# Patient Record
Sex: Female | Born: 1980 | Race: White | Hispanic: No | Marital: Married | Smoking: Never smoker
Health system: Southern US, Community
[De-identification: ages and names within clinical notes are randomized; demographics above are authoritative.]

## PROBLEM LIST (undated history)

## (undated) ENCOUNTER — Inpatient Hospital Stay (HOSPITAL_COMMUNITY): Payer: Self-pay

## (undated) DIAGNOSIS — G43909 Migraine, unspecified, not intractable, without status migrainosus: Secondary | ICD-10-CM

## (undated) DIAGNOSIS — T883XXA Malignant hyperthermia due to anesthesia, initial encounter: Secondary | ICD-10-CM

## (undated) DIAGNOSIS — Z800 Family history of malignant neoplasm of digestive organs: Secondary | ICD-10-CM

## (undated) DIAGNOSIS — R011 Cardiac murmur, unspecified: Secondary | ICD-10-CM

## (undated) DIAGNOSIS — Z803 Family history of malignant neoplasm of breast: Secondary | ICD-10-CM

## (undated) DIAGNOSIS — Z87442 Personal history of urinary calculi: Secondary | ICD-10-CM

## (undated) DIAGNOSIS — Z8041 Family history of malignant neoplasm of ovary: Secondary | ICD-10-CM

## (undated) DIAGNOSIS — F419 Anxiety disorder, unspecified: Secondary | ICD-10-CM

## (undated) DIAGNOSIS — F32A Depression, unspecified: Secondary | ICD-10-CM

## (undated) DIAGNOSIS — Z801 Family history of malignant neoplasm of trachea, bronchus and lung: Secondary | ICD-10-CM

## (undated) DIAGNOSIS — G43109 Migraine with aura, not intractable, without status migrainosus: Secondary | ICD-10-CM

## (undated) DIAGNOSIS — Z808 Family history of malignant neoplasm of other organs or systems: Secondary | ICD-10-CM

## (undated) DIAGNOSIS — O039 Complete or unspecified spontaneous abortion without complication: Secondary | ICD-10-CM

## (undated) HISTORY — DX: Family history of malignant neoplasm of digestive organs: Z80.0

## (undated) HISTORY — PX: BREAST EXCISIONAL BIOPSY: SUR124

## (undated) HISTORY — DX: Family history of malignant neoplasm of ovary: Z80.41

## (undated) HISTORY — PX: MOUTH SURGERY: SHX715

## (undated) HISTORY — DX: Family history of malignant neoplasm of breast: Z80.3

## (undated) HISTORY — DX: Family history of malignant neoplasm of other organs or systems: Z80.8

## (undated) HISTORY — DX: Complete or unspecified spontaneous abortion without complication: O03.9

## (undated) HISTORY — PX: BREAST BIOPSY: SHX20

## (undated) HISTORY — DX: Family history of malignant neoplasm of trachea, bronchus and lung: Z80.1

---

## 2011-10-23 ENCOUNTER — Encounter (HOSPITAL_COMMUNITY): Payer: Self-pay

## 2011-10-23 ENCOUNTER — Emergency Department (HOSPITAL_COMMUNITY)
Admission: EM | Admit: 2011-10-23 | Discharge: 2011-10-23 | Disposition: A | Discharge status: Home / Self Care | Payer: Self-pay | Attending: Emergency Medicine | Admitting: Emergency Medicine

## 2011-10-23 DIAGNOSIS — T6391XA Toxic effect of contact with unspecified venomous animal, accidental (unintentional), initial encounter: Secondary | ICD-10-CM | POA: Insufficient documentation

## 2011-10-23 DIAGNOSIS — T7840XA Allergy, unspecified, initial encounter: Secondary | ICD-10-CM

## 2011-10-23 DIAGNOSIS — T63461A Toxic effect of venom of wasps, accidental (unintentional), initial encounter: Secondary | ICD-10-CM | POA: Insufficient documentation

## 2011-10-23 DIAGNOSIS — Z880 Allergy status to penicillin: Secondary | ICD-10-CM | POA: Insufficient documentation

## 2011-10-23 MED ORDER — PREDNISONE 20 MG PO TABS
60.0000 mg | ORAL_TABLET | Freq: Once | ORAL | Status: AC
  Administered 2011-10-23: 60 mg via ORAL
  Filled 2011-10-23: qty 3

## 2011-10-23 MED ORDER — PREDNISONE 50 MG PO TABS: 50.0000 mg | ORAL_TABLET | Freq: Every day | ORAL | Status: DC

## 2011-10-23 MED ORDER — EPINEPHRINE 0.3 MG/0.3ML IJ DEVI: 0.3000 mg | INTRAMUSCULAR | Status: DC | PRN

## 2011-10-23 NOTE — ED Notes (Signed)
Pt in from home states was stung by bee on left calf and left buttock states allergy to bees states took 2 benadryl, did not use epi pen d/t expiration, presents with redness and swelling to eyes, denies difficulty swallowing airway intact

## 2011-10-23 NOTE — ED Provider Notes (Signed)
History     CSN: [id number]  Arrival date & time 10/23/11  1024   First MD Initiated Contact with Patient 10/23/11 1040      Chief Complaint  Patient presents with  . Insect Bite    (Consider location/radiation/quality/duration/timing/severity/associated sxs/prior treatment) HPI Pt with hx of bee sting allergy presents with c/o bee sting on left anterior shin and left buttock.  This occurred approx 9:30am.  She c/o mild pain at the site of the stings.  Did note some facial tingling and redness under her eyes.  Took benadryl x 2, did not use her epipen when she noted it was expired.  No lip/tongue swelling, no difficulty breathing or vomiting or fainting.  There are no other associated systemic symptoms, there are no other alleviating or modifying factors.   History reviewed. No pertinent past medical history.  History reviewed. No pertinent past surgical history.  No family history on file.  History  Substance Use Topics  . Smoking status: Never Smoker   . Smokeless tobacco: Not on file  . Alcohol Use: No    OB History    Grav Para Term Preterm Abortions TAB SAB Ect Mult Living                  Review of Systems ROS reviewed and all otherwise negative except for mentioned in HPI  Allergies  Bee venom and Penicillins  Home Medications   Current Outpatient Rx  Name Route Sig Dispense Refill  . ACETAMINOPHEN 500 MG PO TABS Oral Take 500 mg by mouth every 6 (six) hours as needed. pain    . IBUPROFEN 200 MG PO TABS Oral Take 400 mg by mouth every 6 (six) hours as needed. pain    . ADULT MULTIVITAMIN W/MINERALS CH Oral Take 1 tablet by mouth daily.    Marland Kitchen EPINEPHRINE 0.3 MG/0.3ML IJ DEVI Intramuscular Inject 0.3 mLs (0.3 mg total) into the muscle as needed. 2 Device 0  . PREDNISONE 50 MG PO TABS Oral Take 1 tablet (50 mg total) by mouth daily. 5 tablet 0    BP 140/84  Pulse 109  Temp 98.2 F (36.8 C) (Oral)  Resp 18  SpO2 100%  LMP 10/01/2011 Vitals  reviewed Physical Exam Physical Examination: General appearance - alert, well appearing, and in no distress Mental status - alert, oriented to person, place, and time Eyes - pupils equal and reactive, no conjunctival injection Mouth - mucous membranes moist, pharynx normal without lesions Chest - clear to auscultation, no wheezes, rales or rhonchi, symmetric air entry, no increased respiratory effort Heart - normal rate, regular rhythm, normal S1, S2, no murmurs, rubs, clicks or gallops Abdomen - soft, nontender, nondistended, no masses or organomegaly Musculoskeletal - no joint tenderness, deformity or swelling Extremities - peripheral pulses normal, no pedal edema, no clubbing or cyanosis Skin - normal coloration and turgor except approx 1cm of local reaction on left anterior lower leg, and left buttock, no hives, brisk cap refill  ED Course  Procedures (including critical care time)  Labs Reviewed - No data to display No results found.   1. Allergic reaction       MDM  Pt with allergy to bee stings- was stung twice by a bee on her left lower extremity and left buttock.  Pt took 2 benadryl prior to arrival.  No sob, no lip or tongue swelling.  Pt given prednisone po.  Discharged with strict return precautions.  Will give a rx for epipen as hers  is expired.  Pt agreeable with this plan.         Ethelda Chick, MD 10/24/11 1029

## 2012-05-05 ENCOUNTER — Encounter: Payer: Self-pay | Admitting: Obstetrics and Gynecology

## 2012-06-01 ENCOUNTER — Encounter: Payer: Self-pay | Admitting: Obstetrics and Gynecology

## 2013-03-22 ENCOUNTER — Ambulatory Visit (INDEPENDENT_AMBULATORY_CARE_PROVIDER_SITE_OTHER): Payer: BC Managed Care – PPO | Admitting: Women's Health

## 2013-03-22 ENCOUNTER — Encounter: Payer: Self-pay | Admitting: Women's Health

## 2013-03-22 ENCOUNTER — Other Ambulatory Visit (HOSPITAL_COMMUNITY)
Admission: RE | Admit: 2013-03-22 | Discharge: 2013-03-22 | Disposition: A | Discharge status: Home / Self Care | Payer: BC Managed Care – PPO | Source: Ambulatory Visit | Attending: Gynecology | Admitting: Gynecology

## 2013-03-22 VITALS — BP 114/70 | Ht 67.0 in | Wt 168.2 lb

## 2013-03-22 DIAGNOSIS — Z01419 Encounter for gynecological examination (general) (routine) without abnormal findings: Secondary | ICD-10-CM

## 2013-03-22 DIAGNOSIS — N926 Irregular menstruation, unspecified: Secondary | ICD-10-CM

## 2013-03-22 LAB — TSH: TSH: 0.555 u[IU]/mL (ref 0.350–4.500)

## 2013-03-22 NOTE — Progress Notes (Signed)
Patricia Franco 09-10-1980 [id number]    History:    The patient presents for annual exam.  Cycles every 25-36 days. Has had 2 early miscarriages while on contraception last being December 2013 at about 6 weeks. Using no contraception for one month, intercourse 3-4 times per week. Currently on a prenatal vitamin daily, aware of safe pregnancy behaviors, positive rubella status, states had  normal labs May 2014 including a prolactin. Tdap 2011.  Reports husband is healthy.  Normal Pap history, did not receive gardasil.   Past medical history, past surgical history, family history and social history were all reviewed and documented in the EPIC chart. From a family of 10 children, all healthy. Father type II diabetic. 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   ROS:  A  ROS was performed and pertinent positives and negatives are included in the history.  Exam:  Filed Vitals:   03/22/13 1013  BP: 114/70    General appearance:  Normal, numerous tattoos Head/Neck:  Normal, without cervical or supraclavicular adenopathy. Thyroid:  Symmetrical, normal in size, without palpable masses or nodularity. Respiratory  Effort:  Normal  Auscultation:  Clear without wheezing or rhonchi Cardiovascular  Auscultation:  Regular rate, without rubs, murmurs or gallops  Edema/varicosities:  Not grossly evident Abdominal  Soft,nontender, without masses, guarding or rebound.  Liver/spleen:  No organomegaly noted  Hernia:  None appreciated  Skin  Inspection:  Grossly normal  Palpation:  Grossly normal Neurologic/psychiatric  Orientation:  Normal with appropriate conversation.  Mood/affect:  Normal  Genitourinary    Breasts: Examined lying and sitting/pierced.     Right: Without masses, retractions, discharge or axillary adenopathy.     Left: Without masses, retractions, discharge or axillary adenopathy.   Inguinal/mons:  Normal without inguinal adenopathy  External  genitalia:  Normal  BUS/Urethra/Skene's glands:  Normal  Bladder:  Normal  Vagina:  Normal  Cervix:  Normal  Uterus:   normal in size, shape and contour.  Midline and mobile  Adnexa/parametria:     Rt: Without masses or tenderness.   Lt: Without masses or tenderness.  Anus and perineum: Normal  Digital rectal exam: Normal sphincter tone without palpated masses or tenderness  Assessment/Plan:  32 y.o.MWF G2P0  for annual exam desiring conception.  Irregular cycles  Plan: Instructed to fax lab results for review. TSH, hCG qualitative, UA, Pap. SBE's, continue PNV daily, regular exercise and healthy lifestyle.  Aware of a healthy pregnancy behaviors. Instructed to return to office with missed cycle for viability ultrasound. Aware we no longer deliver.   Harrington Challenger WHNP, 11:05 AM 03/22/2013

## 2013-03-22 NOTE — Patient Instructions (Signed)
Tdap vaccine for all  Pregnancy If you are planning on getting pregnant, it is a good idea to make a preconception appointment with your caregiver to discuss having a healthy lifestyle before getting pregnant. This includes diet, weight, exercise, taking prenatal vitamins (especially folic acid, which helps prevent brain and spinal cord defects), avoiding alcohol, smoking and illegal drugs, medical problems (diabetes, convulsions), family history of genetic problems, working conditions, and immunizations. It is better to have knowledge of these things and do something about them before getting pregnant. During your pregnancy, it is important to follow certain guidelines in order to have a healthy baby. It is very important to get good prenatal care and follow your caregiver's instructions. Prenatal care includes all the medical care you receive before your baby's birth. This helps to prevent problems during the pregnancy and childbirth. HOME CARE INSTRUCTIONS   Start your prenatal visits by the 12th week of pregnancy or earlier, if possible. At first, appointments are usually scheduled monthly. They become more frequent in the last 2 months before delivery. It is important that you keep your caregiver's appointments and follow your caregiver's instructions regarding medication use, exercise, and diet.  During pregnancy, you are providing food for you and your baby. Eat a regular, well-balanced diet. Choose foods such as meat, fish, milk and other dairy products, vegetables, fruits, whole-grain breads and cereals. Your caregiver will inform you of the ideal weight gain depending on your current height and weight. Drink lots of liquids. Try to drink 8 glasses of water a day.  Alcohol is associated with a number of birth defects including fetal alcohol syndrome. It is best to avoid alcohol completely. Smoking will cause low birth rate and prematurity. Use of alcohol and nicotine during your pregnancy also  increases the chances that your child will be chemically dependent later in their life and may contribute to SIDS (Sudden Infant Death Syndrome).  Do not use illegal drugs.  Only take prescription or over-the-counter medications that are recommended by your caregiver. Other medications can cause genetic and physical problems in the baby.  Morning sickness can often be helped by keeping soda crackers at the bedside. Eat a few before getting up in the morning.  A sexual relationship may be continued until near the end of pregnancy if there are no other problems such as early (premature) leaking of amniotic fluid from the membranes, vaginal bleeding, painful intercourse or belly (abdominal) pain.  Exercise regularly. Check with your caregiver if you are unsure of the safety of some of your exercises.  Do not use hot tubs, steam rooms or saunas. These increase the risk of fainting and hurting yourself and the baby. Swimming is OK for exercise. Get plenty of rest, including afternoon naps when possible, especially in the third trimester.  Avoid toxic odors and chemicals.  Do not wear high heels. They may cause you to lose your balance and fall.  Do not lift over 5 pounds. If you do lift anything, lift with your legs and thighs, not your back.  Avoid long trips, especially in the third trimester.  If you have to travel out of the city or state, take a copy of your medical records with you. SEEK IMMEDIATE MEDICAL CARE IF:   You develop an unexplained oral temperature above 102 F (38.9 C), or as your caregiver suggests.  You have leaking of fluid from the vagina. If leaking membranes are suspected, take your temperature and inform your caregiver of this when you call.  There is vaginal spotting or bleeding. Notify your caregiver of the amount and how many pads are used.  You continue to feel sick to your stomach (nauseous) and have no relief from remedies suggested, or you throw up (vomit)  blood or coffee ground like materials.  You develop upper abdominal pain.  You have round ligament discomfort in the lower abdominal area. This still must be evaluated by your caregiver.  You feel contractions of the uterus.  You do not feel the baby move, or there is less movement than before.  You have painful urination.  You have abnormal vaginal discharge.  You have persistent diarrhea.  You get a severe headache.  You have problems with your vision.  You develop muscle weakness.  You feel dizzy and faint.  You develop shortness of breath.  You develop chest pain.  You have back pain that travels down to your leg and feet.  You feel irregular or a very fast heartbeat.  You develop excessive weight gain in a short period of time (5 pounds in 3 to 5 days).  You are involved in a domestic violence situation. Document Released: 04/05/2005 Document Revised: 10/05/2011 Document Reviewed: 09/27/2008 [facility] Patient Information 2014 [place], [place].

## 2013-03-22 NOTE — Addendum Note (Signed)
Addended by: Richardson Chiquito on: 03/22/2013 11:54 AM   Modules accepted: Orders

## 2013-03-23 ENCOUNTER — Other Ambulatory Visit: Payer: Self-pay | Admitting: Women's Health

## 2013-03-23 DIAGNOSIS — N390 Urinary tract infection, site not specified: Secondary | ICD-10-CM

## 2013-03-23 LAB — URINALYSIS W MICROSCOPIC + REFLEX CULTURE
Casts: NONE SEEN
Crystals: NONE SEEN
Glucose, UA: NEGATIVE mg/dL
Hgb urine dipstick: NEGATIVE
Ketones, ur: NEGATIVE mg/dL
Nitrite: POSITIVE — AB
Specific Gravity, Urine: 1.015 (ref 1.005–1.030)
Urobilinogen, UA: 0.2 mg/dL (ref 0.0–1.0)
pH: 7 (ref 5.0–8.0)

## 2013-03-23 MED ORDER — NITROFURANTOIN MONOHYD MACRO 100 MG PO CAPS: 100.0000 mg | ORAL_CAPSULE | Freq: Two times a day (BID) | ORAL | Status: AC

## 2013-03-25 LAB — URINE CULTURE: Colony Count: >=100000

## 2013-03-28 ENCOUNTER — Other Ambulatory Visit: Payer: Self-pay | Admitting: Women's Health

## 2013-03-28 ENCOUNTER — Telehealth: Payer: Self-pay | Admitting: *Deleted

## 2013-03-28 DIAGNOSIS — N912 Amenorrhea, unspecified: Secondary | ICD-10-CM

## 2013-03-28 MED ORDER — MEDROXYPROGESTERONE ACETATE 10 MG PO TABS: 10.0000 mg | ORAL_TABLET | Freq: Every day | ORAL | Status: DC

## 2013-03-28 NOTE — Telephone Encounter (Signed)
Telephone call, states continues with low abdominal cramping but no cycle. Repeated home U PT which was negative. Negative qualitative hCG 03/22/2013. Provera 10 for 5 days. Instructed to call if no cycle after completing.

## 2013-03-28 NOTE — Telephone Encounter (Signed)
Pt called stating to call if no cycle yet? Pt is c/o headaches and cramping states this has been going on for about 1 week and 1/2 now. Please advise

## 2013-04-19 NOTE — L&D Delivery Note (Signed)
Delivery Note At 10:25 AM a viable and healthy female was delivered via Vaginal, Spontaneous Delivery (Presentation: Right Occiput Anterior).  APGAR: 9, 9; weight .   Placenta status: Intact, Spontaneous.  Cord: 3 vessels with the following complications: loose nuchal cord.  Cord ZO:XWRU  Anesthesia: Epidural  Episiotomy: None Lacerations: 1st degree Suture Repair: 3.0 vicryl rapide Est. Blood Loss (mL): 200  Mom to postpartum.  Baby to Couplet care / Skin to Skin.  Billye Pickerel R 01/07/2014, 11:21 AM

## 2013-05-10 ENCOUNTER — Encounter: Payer: Self-pay | Admitting: Women's Health

## 2013-05-10 ENCOUNTER — Ambulatory Visit (INDEPENDENT_AMBULATORY_CARE_PROVIDER_SITE_OTHER): Payer: BC Managed Care – PPO | Admitting: Women's Health

## 2013-05-10 DIAGNOSIS — N912 Amenorrhea, unspecified: Secondary | ICD-10-CM

## 2013-05-10 LAB — PREGNANCY, URINE: Preg Test, Ur: POSITIVE

## 2013-05-10 NOTE — Progress Notes (Signed)
Patient ID: Patricia AlbeeKathy Axtman, female   DOB: 24-May-1980, 33 y.o.   MRN: [id number] Presents with 6  positive U PTs. History of 2  early SABs. Denies bleeding or cramping, states has had  breast tenderness and heartburn. LMP 04/01/2013.  Exam: Very happy with pregnancy. Positive U PT.  Early pregnancy with history of 2 SABs  Plan: Schedule viability ultrasound. Aware we no longer deliver. Continue prenatal vitamin daily. Safe pregnancy behaviors reviewed. Instructed to call if any bleeding or pain. Alternate Tums and Rolaids for heartburn.

## 2013-05-10 NOTE — Patient Instructions (Signed)
prPregnancy If you are planning on getting pregnant, it is a good idea to make a preconception appointment with your caregiver to discuss having a healthy lifestyle before getting pregnant. This includes diet, weight, exercise, taking prenatal vitamins (especially folic acid, which helps prevent brain and spinal cord defects), avoiding alcohol, smoking and illegal drugs, medical problems (diabetes, convulsions), family history of genetic problems, working conditions, and immunizations. It is better to have knowledge of these things and do something about them before getting pregnant. During your pregnancy, it is important to follow certain guidelines in order to have a healthy baby. It is very important to get good prenatal care and follow your caregiver's instructions. Prenatal care includes all the medical care you receive before your baby's birth. This helps to prevent problems during the pregnancy and childbirth. HOME CARE INSTRUCTIONS   Start your prenatal visits by the 12th week of pregnancy or earlier, if possible. At first, appointments are usually scheduled monthly. They become more frequent in the last 2 months before delivery. It is important that you keep your caregiver's appointments and follow your caregiver's instructions regarding medication use, exercise, and diet.  During pregnancy, you are providing food for you and your baby. Eat a regular, well-balanced diet. Choose foods such as meat, fish, milk and other dairy products, vegetables, fruits, whole-grain breads and cereals. Your caregiver will inform you of the ideal weight gain depending on your current height and weight. Drink lots of liquids. Try to drink 8 glasses of water a day.  Alcohol is associated with a number of birth defects including fetal alcohol syndrome. It is best to avoid alcohol completely. Smoking will cause low birth rate and prematurity. Use of alcohol and nicotine during your pregnancy also increases the chances  that your child will be chemically dependent later in their life and may contribute to SIDS (Sudden Infant Death Syndrome).  Do not use illegal drugs.  Only take prescription or over-the-counter medications that are recommended by your caregiver. Other medications can cause genetic and physical problems in the baby.  Morning sickness can often be helped by keeping soda crackers at the bedside. Eat a few before getting up in the morning.  A sexual relationship may be continued until near the end of pregnancy if there are no other problems such as early (premature) leaking of amniotic fluid from the membranes, vaginal bleeding, painful intercourse or belly (abdominal) pain.  Exercise regularly. Check with your caregiver if you are unsure of the safety of some of your exercises.  Do not use hot tubs, steam rooms or saunas. These increase the risk of fainting and hurting yourself and the baby. Swimming is OK for exercise. Get plenty of rest, including afternoon naps when possible, especially in the third trimester.  Avoid toxic odors and chemicals.  Do not wear high heels. They may cause you to lose your balance and fall.  Do not lift over 5 pounds. If you do lift anything, lift with your legs and thighs, not your back.  Avoid long trips, especially in the third trimester.  If you have to travel out of the city or state, take a copy of your medical records with you. SEEK IMMEDIATE MEDICAL CARE IF:   You develop an unexplained oral temperature above 102 F (38.9 C), or as your caregiver suggests.  You have leaking of fluid from the vagina. If leaking membranes are suspected, take your temperature and inform your caregiver of this when you call.  There is vaginal spotting  or bleeding. Notify your caregiver of the amount and how many pads are used.  You continue to feel sick to your stomach (nauseous) and have no relief from remedies suggested, or you throw up (vomit) blood or coffee ground  like materials.  You develop upper abdominal pain.  You have round ligament discomfort in the lower abdominal area. This still must be evaluated by your caregiver.  You feel contractions of the uterus.  You do not feel the baby move, or there is less movement than before.  You have painful urination.  You have abnormal vaginal discharge.  You have persistent diarrhea.  You get a severe headache.  You have problems with your vision.  You develop muscle weakness.  You feel dizzy and faint.  You develop shortness of breath.  You develop chest pain.  You have back pain that travels down to your leg and feet.  You feel irregular or a very fast heartbeat.  You develop excessive weight gain in a short period of time (5 pounds in 3 to 5 days).  You are involved in a domestic violence situation. Document Released: 04/05/2005 Document Revised: 10/05/2011 Document Reviewed: 09/27/2008 [facility] Patient Information 2014 [place], [place].

## 2013-05-18 ENCOUNTER — Other Ambulatory Visit: Payer: Self-pay | Admitting: Women's Health

## 2013-05-18 ENCOUNTER — Ambulatory Visit (INDEPENDENT_AMBULATORY_CARE_PROVIDER_SITE_OTHER): Payer: BC Managed Care – PPO

## 2013-05-18 ENCOUNTER — Ambulatory Visit (INDEPENDENT_AMBULATORY_CARE_PROVIDER_SITE_OTHER): Payer: BC Managed Care – PPO | Admitting: Women's Health

## 2013-05-18 ENCOUNTER — Encounter: Payer: Self-pay | Admitting: Women's Health

## 2013-05-18 DIAGNOSIS — O3680X0 Pregnancy with inconclusive fetal viability, not applicable or unspecified: Secondary | ICD-10-CM

## 2013-05-18 DIAGNOSIS — N912 Amenorrhea, unspecified: Secondary | ICD-10-CM

## 2013-05-18 LAB — US OB TRANSVAGINAL

## 2013-05-18 NOTE — Progress Notes (Signed)
Patient ID: Doree AlbeeKathy Franco, female   DOB: [birth year], 33 y.o.   MRN: [id number] Presents for viability ultrasound. History of 2 SABs early. Denies bleeding, pain, change in discharge or urinary symptoms. States is doing well.  Ultrasound: Intrauterine gestational sac with yolk sac. Fetal pole with fetal heart tones. Ovaries appear normal with CLC on right ovary. No free fluid noted. 6 weeks 5 days dates/ clinical 6 weeks 0 days.  Early pregnancy  Plan: Keep scheduled new OB appointment. Congratulated, continue prenatal vitamins daily. Aware of safe behaviors with pregnancy.

## 2013-06-08 LAB — OB RESULTS CONSOLE ABO/RH: RH Type: POSITIVE

## 2013-06-08 LAB — OB RESULTS CONSOLE GC/CHLAMYDIA
CHLAMYDIA, DNA PROBE: NEGATIVE
GC PROBE AMP, GENITAL: NEGATIVE

## 2013-06-08 LAB — OB RESULTS CONSOLE HEPATITIS B SURFACE ANTIGEN: Hepatitis B Surface Ag: NEGATIVE

## 2013-06-08 LAB — OB RESULTS CONSOLE ANTIBODY SCREEN: ANTIBODY SCREEN: NEGATIVE

## 2013-06-08 LAB — OB RESULTS CONSOLE HIV ANTIBODY (ROUTINE TESTING): HIV: NONREACTIVE

## 2013-06-08 LAB — OB RESULTS CONSOLE RUBELLA ANTIBODY, IGM: Rubella: IMMUNE

## 2013-06-08 LAB — OB RESULTS CONSOLE RPR: RPR: NONREACTIVE

## 2013-10-06 ENCOUNTER — Encounter (HOSPITAL_COMMUNITY): Payer: Self-pay | Admitting: *Deleted

## 2013-10-06 ENCOUNTER — Inpatient Hospital Stay (HOSPITAL_COMMUNITY)
Admission: AD | Admit: 2013-10-06 | Discharge: 2013-10-06 | Disposition: A | Discharge status: Home / Self Care | Payer: BC Managed Care – PPO | Source: Ambulatory Visit | Attending: Obstetrics and Gynecology | Admitting: Obstetrics and Gynecology

## 2013-10-06 DIAGNOSIS — G43109 Migraine with aura, not intractable, without status migrainosus: Secondary | ICD-10-CM | POA: Diagnosis present

## 2013-10-06 DIAGNOSIS — R51 Headache: Secondary | ICD-10-CM | POA: Insufficient documentation

## 2013-10-06 DIAGNOSIS — O9989 Other specified diseases and conditions complicating pregnancy, childbirth and the puerperium: Principal | ICD-10-CM

## 2013-10-06 DIAGNOSIS — H43399 Other vitreous opacities, unspecified eye: Secondary | ICD-10-CM | POA: Insufficient documentation

## 2013-10-06 DIAGNOSIS — O99891 Other specified diseases and conditions complicating pregnancy: Secondary | ICD-10-CM | POA: Insufficient documentation

## 2013-10-06 DIAGNOSIS — G43119 Migraine with aura, intractable, without status migrainosus: Secondary | ICD-10-CM

## 2013-10-06 HISTORY — DX: Malignant hyperthermia due to anesthesia, initial encounter: T88.3XXA

## 2013-10-06 HISTORY — DX: Migraine, unspecified, not intractable, without status migrainosus: G43.909

## 2013-10-06 HISTORY — DX: Migraine with aura, not intractable, without status migrainosus: G43.109

## 2013-10-06 HISTORY — DX: Cardiac murmur, unspecified: R01.1

## 2013-10-06 LAB — COMPREHENSIVE METABOLIC PANEL
ALBUMIN: 2.9 g/dL — AB (ref 3.5–5.2)
ALT: 9 U/L (ref 0–35)
AST: 12 U/L (ref 0–37)
Alkaline Phosphatase: 66 U/L (ref 39–117)
BUN: 8 mg/dL (ref 6–23)
CHLORIDE: 102 meq/L (ref 96–112)
CO2: 23 mEq/L (ref 19–32)
CREATININE: 0.58 mg/dL (ref 0.50–1.10)
Calcium: 8.5 mg/dL (ref 8.4–10.5)
GFR calc Af Amer: >90 mL/min (ref >90)
GFR calc non Af Amer: >90 mL/min (ref >90)
Glucose, Bld: 82 mg/dL (ref 70–99)
POTASSIUM: 3.8 meq/L (ref 3.7–5.3)
SODIUM: 136 meq/L — AB (ref 137–147)
Total Protein: 6.4 g/dL (ref 6.0–8.3)

## 2013-10-06 LAB — URINALYSIS, ROUTINE W REFLEX MICROSCOPIC
Bilirubin Urine: NEGATIVE
GLUCOSE, UA: NEGATIVE mg/dL
Hgb urine dipstick: NEGATIVE
KETONES UR: NEGATIVE mg/dL
LEUKOCYTES UA: NEGATIVE
Nitrite: NEGATIVE
PH: 6 (ref 5.0–8.0)
Protein, ur: NEGATIVE mg/dL
Specific Gravity, Urine: <1.005 — ABNORMAL LOW (ref 1.005–1.030)
Urobilinogen, UA: 0.2 mg/dL (ref 0.0–1.0)

## 2013-10-06 LAB — CBC
HCT: 35.1 % — ABNORMAL LOW (ref 36.0–46.0)
Hemoglobin: 12 g/dL (ref 12.0–15.0)
MCH: 32 pg (ref 26.0–34.0)
MCHC: 34.2 g/dL (ref 30.0–36.0)
MCV: 93.6 fL (ref 78.0–100.0)
PLATELETS: 207 10*3/uL (ref 150–400)
RBC: 3.75 MIL/uL — ABNORMAL LOW (ref 3.87–5.11)
RDW: 14.1 % (ref 11.5–15.5)
WBC: 10.4 10*3/uL (ref 4.0–10.5)

## 2013-10-06 LAB — URIC ACID: Uric Acid, Serum: 4.1 mg/dL (ref 2.4–7.0)

## 2013-10-06 MED ORDER — LACTATED RINGERS IV BOLUS (SEPSIS)
500.0000 mL | Freq: Once | INTRAVENOUS | Status: AC
  Administered 2013-10-06: 500 mL via INTRAVENOUS

## 2013-10-06 MED ORDER — PROMETHAZINE HCL 25 MG/ML IJ SOLN
12.5000 mg | Freq: Once | INTRAMUSCULAR | Status: AC
  Administered 2013-10-06: 12.5 mg via INTRAVENOUS
  Filled 2013-10-06: qty 1

## 2013-10-06 MED ORDER — BUTORPHANOL TARTRATE 1 MG/ML IJ SOLN
1.0000 mg | Freq: Once | INTRAMUSCULAR | Status: AC
  Administered 2013-10-06: 1 mg via INTRAVENOUS
  Filled 2013-10-06: qty 1

## 2013-10-06 MED ORDER — BUTALBITAL-APAP-CAFFEINE 50-325-40 MG PO TABS: 1.0000 | ORAL_TABLET | Freq: Four times a day (QID) | ORAL | Status: DC | PRN

## 2013-10-06 NOTE — Discharge Instructions (Signed)

## 2013-10-06 NOTE — MAU Note (Signed)
Headache for several days. Worse today. Seeing floaters. Teeth and face hurts. Throbbing h/a. Light sensitive but not to sounds. Hx migraines. Itching all over last few hrs

## 2013-10-06 NOTE — MAU Note (Signed)
Headache for several days. Worse today. Seeing floaters. Teeth and face hurts. Throbbing h/a

## 2013-10-16 ENCOUNTER — Encounter (HOSPITAL_COMMUNITY): Payer: Self-pay | Admitting: *Deleted

## 2013-10-16 ENCOUNTER — Encounter (HOSPITAL_COMMUNITY): Payer: Self-pay

## 2013-11-12 ENCOUNTER — Encounter (HOSPITAL_COMMUNITY): Payer: Self-pay | Admitting: Anesthesiology

## 2013-11-12 NOTE — Anesthesia Preprocedure Evaluation (Deleted)
Anesthesia Evaluation    History of Anesthesia Complications (+) MALIGNANT HYPERTHERMIA, Family history of anesthesia reaction and history of anesthetic complications (Strong FH of MH: Pt's sisters x2 have tested + for MH by muscle bx.  Pt's mother has had MH x2.  Pt's uncle and grandmother died of MH.  Pt had GA at 33 yo and procedure was aborted when she spiked a temp.)  Airway       Dental   Pulmonary          Cardiovascular     Neuro/Psych  Headaches (migraines - about once a year),    GI/Hepatic   Endo/Other    Renal/GU      Musculoskeletal   Abdominal   Peds  Hematology   Anesthesia Other Findings   Reproductive/Obstetrics (+) Pregnancy (EDD 01/06/14)                         Anesthesia Physical Anesthesia Plan  ASA: II  Anesthesia Plan: Epidural   Post-op Pain Management:    Induction:   Airway Management Planned:   Additional Equipment:   Intra-op Plan:   Post-operative Plan:   Informed Consent:   Plan Discussed with: Surgeon  Anesthesia Plan Comments: (Plan early epidural and plan to have a clean OR kept available while pt is in labor in the event that a non-triggering GA is needed.    Spoke with pt by phone [phone]  on 11/12/13.  All questions answered and reassurance given.  Jasmine DecemberA. Birdie Beveridge, MD)       Anesthesia Quick Evaluation

## 2013-11-30 ENCOUNTER — Encounter (HOSPITAL_COMMUNITY): Payer: Self-pay | Admitting: Anesthesiology

## 2013-11-30 ENCOUNTER — Encounter (HOSPITAL_COMMUNITY)
Admission: RE | Admit: 2013-11-30 | Discharge: 2013-11-30 | Disposition: A | Discharge status: Home / Self Care | Payer: BC Managed Care – PPO | Source: Ambulatory Visit | Attending: Obstetrics & Gynecology | Admitting: Obstetrics & Gynecology

## 2013-11-30 ENCOUNTER — Encounter (HOSPITAL_COMMUNITY): Payer: Self-pay

## 2013-11-30 DIAGNOSIS — Z01818 Encounter for other preprocedural examination: Secondary | ICD-10-CM | POA: Diagnosis present

## 2013-11-30 NOTE — Pre-Procedure Instructions (Signed)
Brief interview and health history for anesthesia consult.Dr. Malen GauzeFoster notifed that pt here to see him.

## 2013-11-30 NOTE — Anesthesia Preprocedure Evaluation (Deleted)
Anesthesia Evaluation  Patient identified by MRN, date of birth, ID band Patient awake    Reviewed: Allergy & Precautions, H&P   History of Anesthesia Complications (+) MALIGNANT HYPERTHERMIA and history of anesthetic complications  Airway Mallampati: II TM Distance: >3 FB Neck ROM: Full    Dental no notable dental hx. (+) Caps   Pulmonary neg pulmonary ROS,  breath sounds clear to auscultation  Pulmonary exam normal       Cardiovascular negative cardio ROS  + Valvular Problems/Murmurs Rhythm:Regular Rate:Normal     Neuro/Psych  Headaches,    GI/Hepatic negative GI ROS, Neg liver ROS,   Endo/Other  negative endocrine ROS  Renal/GU negative Renal ROS  negative genitourinary   Musculoskeletal Strong family Hx/o MH   Abdominal (+) + obese,   Peds  Hematology negative hematology ROS (+)   Anesthesia Other Findings   Reproductive/Obstetrics (+) Pregnancy                           Anesthesia Physical Anesthesia Plan  ASA: II  Anesthesia Plan: Epidural   Post-op Pain Management:    Induction:   Airway Management Planned: Natural Airway  Additional Equipment:   Intra-op Plan:   Post-operative Plan:   Informed Consent:   Plan Discussed with:   Anesthesia Plan Comments: (I had a long discussion with Ms. Mcbrearty concerning her family hx/o MH. Her mother had an episode of MH. She has 2 younger sisters who have had muscle biopsies with positive Halothane contraction tests. She has a maternal uncle who died from an MH episode as well as a maternal great aunt and grandfather who also died after anesthesia. I explained in detail why we needed to know about her prior to delivery and to make sure she makes the nurses aware she probably has MH and to notify the anesthesiologist on call when she goes into labor. We discussed triggering agents and events which have been known to precipitate MH.  We also discussed treatment as well as prevention and precautionary measures. I recommended that she have the blood test for MH at minimum to see if that tests positive for MH. I also recommended that she get a Medic Alert bracelet or necklace to wear all the time. When she goes into labor I recommend early epidural placement and set aside a separate OR probably Rm. 2 just in case an emergency C/section is needed. I also told her to talk with Dr. Juliene PinaMody to see if she thinks a MFM consult with genetic counseling is necessary. I referred her to [facility] web site if she wanted to learn more about MH. She and her husband understand the plan. I spent 30 minutes face to face contact with the patient.)        Anesthesia Quick Evaluation

## 2013-12-04 LAB — OB RESULTS CONSOLE GBS: GBS: NEGATIVE

## 2013-12-15 ENCOUNTER — Inpatient Hospital Stay (HOSPITAL_COMMUNITY)
Admission: AD | Admit: 2013-12-15 | Discharge: 2013-12-15 | Disposition: A | Discharge status: Home / Self Care | Payer: BC Managed Care – PPO | Source: Ambulatory Visit | Attending: Obstetrics & Gynecology | Admitting: Obstetrics & Gynecology

## 2013-12-15 ENCOUNTER — Encounter (HOSPITAL_COMMUNITY): Payer: Self-pay | Admitting: *Deleted

## 2013-12-15 DIAGNOSIS — O479 False labor, unspecified: Secondary | ICD-10-CM | POA: Diagnosis present

## 2013-12-15 LAB — URINALYSIS, ROUTINE W REFLEX MICROSCOPIC
Bilirubin Urine: NEGATIVE
Glucose, UA: NEGATIVE mg/dL
Hgb urine dipstick: NEGATIVE
Ketones, ur: NEGATIVE mg/dL
LEUKOCYTES UA: NEGATIVE
NITRITE: NEGATIVE
PH: 7 (ref 5.0–8.0)
Protein, ur: NEGATIVE mg/dL
Urobilinogen, UA: 0.2 mg/dL (ref 0.0–1.0)

## 2013-12-15 NOTE — MAU Note (Signed)
C/o ucs since 1400 yesterday that have gotten closer and stronger; ucs are 7-8 minutes apart now;

## 2013-12-15 NOTE — Discharge Instructions (Signed)

## 2013-12-15 NOTE — MAU Note (Signed)
Pt states ctx's q5-7 minutes, denies bleeding or gush of fluid. Does note more mucus like discharge.

## 2013-12-17 ENCOUNTER — Inpatient Hospital Stay (HOSPITAL_COMMUNITY)
Admission: AD | Admit: 2013-12-17 | Discharge: 2013-12-17 | Disposition: A | Discharge status: Home / Self Care | Payer: BC Managed Care – PPO | Source: Ambulatory Visit | Attending: Obstetrics and Gynecology | Admitting: Obstetrics and Gynecology

## 2013-12-17 ENCOUNTER — Encounter (HOSPITAL_COMMUNITY): Payer: Self-pay | Admitting: *Deleted

## 2013-12-17 DIAGNOSIS — S1093XA Contusion of unspecified part of neck, initial encounter: Secondary | ICD-10-CM

## 2013-12-17 DIAGNOSIS — Z9181 History of falling: Secondary | ICD-10-CM

## 2013-12-17 DIAGNOSIS — Z3689 Encounter for other specified antenatal screening: Secondary | ICD-10-CM

## 2013-12-17 DIAGNOSIS — S0083XA Contusion of other part of head, initial encounter: Secondary | ICD-10-CM | POA: Insufficient documentation

## 2013-12-17 DIAGNOSIS — O99891 Other specified diseases and conditions complicating pregnancy: Secondary | ICD-10-CM | POA: Diagnosis not present

## 2013-12-17 DIAGNOSIS — Y929 Unspecified place or not applicable: Secondary | ICD-10-CM | POA: Insufficient documentation

## 2013-12-17 DIAGNOSIS — S0003XA Contusion of scalp, initial encounter: Secondary | ICD-10-CM | POA: Insufficient documentation

## 2013-12-17 DIAGNOSIS — O368190 Decreased fetal movements, unspecified trimester, not applicable or unspecified: Secondary | ICD-10-CM | POA: Insufficient documentation

## 2013-12-17 DIAGNOSIS — O9989 Other specified diseases and conditions complicating pregnancy, childbirth and the puerperium: Secondary | ICD-10-CM

## 2013-12-17 DIAGNOSIS — W108XXA Fall (on) (from) other stairs and steps, initial encounter: Secondary | ICD-10-CM | POA: Insufficient documentation

## 2013-12-17 MED ORDER — ACETAMINOPHEN 500 MG PO TABS
1000.0000 mg | ORAL_TABLET | Freq: Four times a day (QID) | ORAL | Status: DC | PRN
  Administered 2013-12-17: 1000 mg via ORAL
  Filled 2013-12-17: qty 2

## 2013-12-17 NOTE — MAU Provider Note (Signed)
History     CSN: [id number]  Arrival date and time: 12/17/13 1517   First Provider Initiated Contact with Patient 12/17/13 1601      Chief Complaint  Patient presents with  . Fall  . Decreased Fetal Movement   HPI Comments: G3P0020 .1 wks s/p fall down flight of stairs today around 1330. Denies LOC. Unsure of abdominal or head contact. Reports contact of right side of face with banister. Landed on right side. +nausea and HA immediately after, only mild HA now. No fetal movement since. No VB or LOF. BH ctx present, not painful.    OB History   Grav Para Term Preterm Abortions TAB SAB Ect Mult Living   Past Medical History  Diagnosis Date  . Miscarriage 2012, 2013  . Heart murmur   . Migraines   . Migraine headache with aura 10/06/2013  . Malignant hyperthermia     pt's mother  . Malignant hyperthermia     Past Surgical History  Procedure Laterality Date  . Mouth surgery      Family History  Problem Relation Age of Onset  . Other Mother     malignant hyperthermia  . Other Sister     malignant hyperthermia  . Other Brother     malignant hyperthermia  . Thyroid disease Maternal Aunt   . Other Maternal Uncle     malignant hyperthermia  . Other Sister     malignant hyperthermia  . Other Sister     malignant hyperthermia  . Other Sister     malignant hyperthermia  . Other Sister     malignant hyperthermia  . Other Brother     malignant hyperthermia  . Other Brother     malignant hyperthermia  . Other Sister     malignant hyperthermia    History  Substance Use Topics  . Smoking status: Never Smoker   . Smokeless tobacco: Not on file  . Alcohol Use: No    Allergies:  Allergies  Allergen Reactions  . Bee Venom Anaphylaxis and Swelling  . Penicillins   . Penicillins Hives and Swelling    Prescriptions prior to admission  Medication Sig Dispense Refill  . acetaminophen (TYLENOL) 500 MG tablet Take 500 mg by mouth every 6  (six) hours as needed for mild pain.       Marland Kitchen NIFEdipine (PROCARDIA) 10 MG capsule Take 10 mg by mouth every 6 (six) hours.      . Prenatal Vit-Fe Fumarate-FA (PRENATAL MULTIVITAMIN) TABS tablet Take 1 tablet by mouth daily.      Marland Kitchen EPINEPHrine (EPIPEN IJ) Inject 1 each as directed as needed (for anaphylaxis).        Review of Systems  Constitutional: Negative.   Eyes: Negative.   Respiratory: Negative.   Cardiovascular: Negative.   Gastrointestinal: Negative.   Genitourinary: Negative.   Musculoskeletal: Positive for falls.  Skin: Negative.   Neurological: Positive for headaches.  Endo/Heme/Allergies: Negative.   Psychiatric/Behavioral: Negative.    Physical Exam   Blood pressure 112/65, pulse 85, temperature 98.3 F (36.8 C), temperature source Oral, resp. rate 18, height  (1.702 m), weight 91.627 kg (202 lb), last menstrual period 04/01/2013, SpO2 100.00%.  Physical Exam  Constitutional: She is oriented to person, place, and time. She appears well-developed and well-nourished.  HENT:  Head: Normocephalic.  Large edematous right zygomatic contusion   Eyes: Pupils are equal, round, and reactive  to light.  Neck: Normal range of motion. Neck supple.  Cardiovascular: Normal rate.   Respiratory: Effort normal.  GI: Soft.  Genitourinary: Vagina normal.  SVE: 0/50/-2  Musculoskeletal: Normal range of motion.  Neurological: She is alert and oriented to person, place, and time.  Skin: Skin is warm and dry.  Psychiatric: She has a normal mood and affect.    MAU Course  Procedures Prolonged fetal monitoring NST + fetal movement noted   Assessment and Plan  37.[redacted] week gestation S/p fall Facial contusion Reactive NST-no sx of abruption Rh positive  Discharge, proceed immediately to [facility] for head/face evaluation Labor precautions/abruption precautions Follow-up with Dr. Juliene Pina in 3 days in office Dr. Billy Coast notified of A/P, agrees.   Arlena Marsan,  N 12/17/2013, 4:12 PM

## 2013-12-17 NOTE — MAU Note (Signed)
Urine in lab 

## 2013-12-17 NOTE — Discharge Instructions (Signed)
Placental Abruption Your placenta is the organ that nourishes your unborn baby (fetus). Your baby gets his or her blood supply and nutrients through your placenta. It is your baby's life support system. It is attached to the inside of your uterus until after your baby is born.  Placental abruption is when the placenta partly or completely separates from the uterus before your baby is born. This is rare, but it can happen any time after 20 weeks of pregnancy. A small separation may not cause problems, but a large separation may be dangerous for you and your baby. CAUSES  Most of the time the cause of a placental abruption is unknown. Though it is rare, a placental abruption can be caused by:   An abdominal injury.   The baby turning from a buttocks-first position (breech presentation) or a sideways position (transverse) to a headfirst position (cephalic).   Delivering the first of multiple babies (twins, triplets, or more).   Sudden loss of amniotic fluid (premature rupture of the membranes).   An abnormally short umbilical cord. RISK FACTORS Some risk factors make a placental abruption more likely, including:  History of placental abruption.  High blood pressure (hypertension).  Smoking.  Alcohol intake.  Blood clotting problems.  Too much amniotic fluid.  Having had multiples (twins or triplets or more).  Seizures and convulsions.  Diabetes mellitus.  Having had more than four children.  Age 42 years or older.  Illegal drug use.  Injury to your abdomen. SIGNS AND SYMPTOMS  A small placental abruption may not cause symptoms. If you do have symptoms, they may include:  Mild abdominal pain.  Slight vaginal bleeding. Symptoms of severe placental abruption depend on the size of the separation and the stage of pregnancy. Symptoms may include:   Sudden pain in your uterus.  Abdominal pain.  Vaginal bleeding.  Tender uterus.  Severe abdominal pain with  tenderness.  Continual contractions of your uterus.  Back pain.  Weakness, light-headedness. DIAGNOSIS  Placental abruption is suspected when a pregnant woman develops sudden pain in her uterus. The health care provider will check whether the uterus is very tender, hard, and enlarging and whether the baby has an abnormal heart rate or rhythm. Ultrasonography (commonly called an ultrasound) will be done. Blood work will also be done to make sure that there are enough healthy red blood cells and that there are no clotting problems or signs of too much blood loss. TREATMENT  Placental abruption is usually an emergency. It requires treatment right away. Your treatment will depend on:   The amount of bleeding.  Whether you or you baby are in distress.  The stage of your pregnancy.  The maturity of the baby. Treatment for partial separation of the placenta is bed rest and close observation. You also may need a blood transfusion or to receive fluids through an IV tube. Treatment for complete placental separation is delivery of your baby. You may have a cesarean delivery if your baby is in distress. HOME CARE INSTRUCTIONS   Only take medicines as directed by your health care provider.  Arrange for help at home before and after you deliver the baby, especially if you had a cesarean delivery or lost a lot of blood.  Get plenty of rest and sleep.  Do not have sexual intercourse until your health care provider says it is okay.  Do not use tampons or douche unless your health care provider says it is okay. SEEK MEDICAL CARE IF:  You  have light vaginal bleeding or spotting.  You have any type of trauma, such as a fall or jolt during an accident.  You are having trouble avoiding drugs, alcohol, or smoking. SEEK IMMEDIATE MEDICAL CARE IF:  You have vaginal bleeding.  You have abdominal pain.  You have continuous uterine contractions.  You have a hard, tender uterus.  You do not feel  the baby move, or the baby moves very little. MAKE SURE YOU:  Understand these instructions.  Will watch your condition.  Will get help right away if you are not doing well or get worse. Document Released: 04/05/2005 Document Revised: 04/10/2013 Document Reviewed: 01/26/2013 [facility] Patient Information 2015 [place], [place]. This information is not intended to replace advice given to you by your health care provider. Make sure you discuss any questions you have with your health care provider. Braxton Hicks Contractions Contractions of the uterus can occur throughout pregnancy. Contractions are not always a sign that you are in labor.  WHAT ARE BRAXTON HICKS CONTRACTIONS?  Contractions that occur before labor are called Braxton Hicks contractions, or false labor. Toward the end of pregnancy (32-34 weeks), these contractions can develop more often and may become more forceful. This is not true labor because these contractions do not result in opening (dilatation) and thinning of the cervix. They are sometimes difficult to tell apart from true labor because these contractions can be forceful and people have different pain tolerances. You should not feel embarrassed if you go to the hospital with false labor. Sometimes, the only way to tell if you are in true labor is for your health care provider to look for changes in the cervix. If there are no prenatal problems or other health problems associated with the pregnancy, it is completely safe to be sent home with false labor and await the onset of true labor. HOW CAN YOU TELL THE DIFFERENCE BETWEEN TRUE AND FALSE LABOR? False Labor  The contractions of false labor are usually shorter and not as hard as those of true labor.   The contractions are usually irregular.   The contractions are often felt in the front of the lower abdomen and in the groin.   The contractions may go away when you walk around or change positions while lying down.    The contractions get weaker and are shorter lasting as time goes on.   The contractions do not usually become progressively stronger, regular, and closer together as with true labor.  True Labor  Contractions in true labor last 30-70 seconds, become very regular, usually become more intense, and increase in frequency.   The contractions do not go away with walking.   The discomfort is usually felt in the top of the uterus and spreads to the lower abdomen and low back.   True labor can be determined by your health care provider with an exam. This will show that the cervix is dilating and getting thinner.  WHAT TO REMEMBER  Keep up with your usual exercises and follow other instructions given by your health care provider.   Take medicines as directed by your health care provider.   Keep your regular prenatal appointments.   Eat and drink lightly if you think you are going into labor.   If Braxton Hicks contractions are making you uncomfortable:   Change your position from lying down or resting to walking, or from walking to resting.   Sit and rest in a tub of warm water.   Drink 2-3 glasses  of water. Dehydration may cause these contractions.   Do slow and deep breathing several times an hour.  WHEN SHOULD I SEEK IMMEDIATE MEDICAL CARE? Seek immediate medical care if:  Your contractions become stronger, more regular, and closer together.   You have fluid leaking or gushing from your vagina.   You have a fever.   You pass blood-tinged mucus.   You have vaginal bleeding.   You have continuous abdominal pain.   You have low back pain that you never had before.   You feel your baby's head pushing down and causing pelvic pressure.   Your baby is not moving as much as it used to.  Document Released: 04/05/2005 Document Revised: 04/10/2013 Document Reviewed: 01/15/2013 [facility] Patient Information 2015 [place], [place]. This information is not  intended to replace advice given to you by your health care provider. Make sure you discuss any questions you have with your health care provider.

## 2013-12-17 NOTE — MAU Note (Signed)
Patient presents to MAU with c/o decreased fetal movement after falling down stairs at 1:20 this afternoon; states has felt no movement since the fall. FHR 140's; EFM applied and assessing. Denies VB, LOF, or contractions at this time. States having lower back pain that is cramping in nature. RN noticed patient with bruised black right eye; patient states happened with fall. Ice pack given. Patient alert and oriented x4. CNM made aware of patients arrival.

## 2013-12-20 ENCOUNTER — Telehealth (HOSPITAL_COMMUNITY): Payer: Self-pay | Admitting: *Deleted

## 2013-12-20 ENCOUNTER — Encounter (HOSPITAL_COMMUNITY): Payer: Self-pay | Admitting: *Deleted

## 2013-12-20 NOTE — Telephone Encounter (Signed)
Preadmission screen  

## 2013-12-21 ENCOUNTER — Other Ambulatory Visit: Payer: Self-pay | Admitting: Obstetrics & Gynecology

## 2013-12-30 ENCOUNTER — Inpatient Hospital Stay (HOSPITAL_COMMUNITY): Admission: RE | Admit: 2013-12-30 | Payer: BC Managed Care – PPO | Source: Ambulatory Visit

## 2014-01-06 ENCOUNTER — Encounter (HOSPITAL_COMMUNITY): Payer: Self-pay

## 2014-01-06 ENCOUNTER — Inpatient Hospital Stay (HOSPITAL_COMMUNITY)
Admission: RE | Admit: 2014-01-06 | Discharge: 2014-01-09 | DRG: 775 | Disposition: A | Discharge status: Home / Self Care | Payer: BC Managed Care – PPO | Source: Ambulatory Visit | Attending: Obstetrics & Gynecology | Admitting: Obstetrics & Gynecology

## 2014-01-06 VITALS — BP 120/68 | HR 77 | Temp 98.0°F | Resp 18 | Ht 67.0 in | Wt 202.0 lb

## 2014-01-06 DIAGNOSIS — O480 Post-term pregnancy: Principal | ICD-10-CM | POA: Diagnosis present

## 2014-01-06 DIAGNOSIS — G43109 Migraine with aura, not intractable, without status migrainosus: Secondary | ICD-10-CM

## 2014-01-06 DIAGNOSIS — Z8489 Family history of other specified conditions: Secondary | ICD-10-CM

## 2014-01-06 DIAGNOSIS — O99891 Other specified diseases and conditions complicating pregnancy: Secondary | ICD-10-CM | POA: Diagnosis present

## 2014-01-06 DIAGNOSIS — Z3490 Encounter for supervision of normal pregnancy, unspecified, unspecified trimester: Secondary | ICD-10-CM

## 2014-01-06 LAB — CBC
HEMATOCRIT: 35.4 % — AB (ref 36.0–46.0)
HEMOGLOBIN: 12.1 g/dL (ref 12.0–15.0)
MCH: 31.8 pg (ref 26.0–34.0)
MCHC: 34.2 g/dL (ref 30.0–36.0)
MCV: 92.9 fL (ref 78.0–100.0)
Platelets: 178 10*3/uL (ref 150–400)
RBC: 3.81 MIL/uL — ABNORMAL LOW (ref 3.87–5.11)
RDW: 13.6 % (ref 11.5–15.5)
WBC: 9.8 10*3/uL (ref 4.0–10.5)

## 2014-01-06 MED ORDER — PHENYLEPHRINE 40 MCG/ML (10ML) SYRINGE FOR IV PUSH (FOR BLOOD PRESSURE SUPPORT)
80.0000 ug | PREFILLED_SYRINGE | INTRAVENOUS | Status: DC | PRN
  Filled 2014-01-06: qty 10
  Filled 2014-01-06: qty 2
  Filled 2014-01-06: qty 10

## 2014-01-06 MED ORDER — PHENYLEPHRINE 40 MCG/ML (10ML) SYRINGE FOR IV PUSH (FOR BLOOD PRESSURE SUPPORT)
80.0000 ug | PREFILLED_SYRINGE | INTRAVENOUS | Status: DC | PRN
  Filled 2014-01-06: qty 2

## 2014-01-06 MED ORDER — DIPHENHYDRAMINE HCL 50 MG/ML IJ SOLN: 12.5000 mg | INTRAMUSCULAR | Status: DC | PRN

## 2014-01-06 MED ORDER — LACTATED RINGERS IV SOLN: 500.0000 mL | INTRAVENOUS | Status: DC | PRN

## 2014-01-06 MED ORDER — MISOPROSTOL 25 MCG QUARTER TABLET
25.0000 ug | ORAL_TABLET | ORAL | Status: DC | PRN
  Administered 2014-01-06: 25 ug via VAGINAL
  Filled 2014-01-06: qty 1
  Filled 2014-01-06: qty 0.25

## 2014-01-06 MED ORDER — CITRIC ACID-SODIUM CITRATE 334-500 MG/5ML PO SOLN: 30.0000 mL | ORAL | Status: DC | PRN

## 2014-01-06 MED ORDER — OXYCODONE-ACETAMINOPHEN 5-325 MG PO TABS: 1.0000 | ORAL_TABLET | ORAL | Status: DC | PRN

## 2014-01-06 MED ORDER — LACTATED RINGERS IV SOLN: INTRAVENOUS | Status: DC

## 2014-01-06 MED ORDER — ACETAMINOPHEN 325 MG PO TABS
650.0000 mg | ORAL_TABLET | ORAL | Status: DC | PRN
Start: 2014-01-06 — End: 2014-01-07

## 2014-01-06 MED ORDER — OXYTOCIN 40 UNITS IN LACTATED RINGERS INFUSION - SIMPLE MED
62.5000 mL/h | INTRAVENOUS | Status: DC
  Filled 2014-01-06: qty 1000

## 2014-01-06 MED ORDER — TERBUTALINE SULFATE 1 MG/ML IJ SOLN: 0.2500 mg | Freq: Once | INTRAMUSCULAR | Status: AC | PRN

## 2014-01-06 MED ORDER — ZOLPIDEM TARTRATE 5 MG PO TABS
5.0000 mg | ORAL_TABLET | Freq: Every evening | ORAL | Status: DC | PRN
  Administered 2014-01-06: 5 mg via ORAL
  Filled 2014-01-06: qty 1

## 2014-01-06 MED ORDER — ONDANSETRON HCL 4 MG/2ML IJ SOLN: 4.0000 mg | Freq: Four times a day (QID) | INTRAMUSCULAR | Status: DC | PRN

## 2014-01-06 MED ORDER — EPHEDRINE 5 MG/ML INJ
10.0000 mg | INTRAVENOUS | Status: DC | PRN
  Filled 2014-01-06: qty 2

## 2014-01-06 MED ORDER — OXYCODONE-ACETAMINOPHEN 5-325 MG PO TABS: 2.0000 | ORAL_TABLET | ORAL | Status: DC | PRN

## 2014-01-06 MED ORDER — LIDOCAINE HCL (PF) 1 % IJ SOLN
INTRAMUSCULAR | Status: DC | PRN
  Administered 2014-01-06: 7 mL

## 2014-01-06 MED ORDER — OXYTOCIN BOLUS FROM INFUSION
500.0000 mL | INTRAVENOUS | Status: DC
  Administered 2014-01-07: 500 mL via INTRAVENOUS

## 2014-01-06 MED ORDER — FENTANYL 2.5 MCG/ML BUPIVACAINE 1/10 % EPIDURAL INFUSION (WH - ANES)
14.0000 mL/h | INTRAMUSCULAR | Status: DC | PRN
  Administered 2014-01-07 (×2): 14 mL/h via EPIDURAL
  Filled 2014-01-06 (×2): qty 125

## 2014-01-06 MED ORDER — LACTATED RINGERS IV SOLN: 500.0000 mL | Freq: Once | INTRAVENOUS | Status: DC

## 2014-01-06 MED ORDER — LIDOCAINE HCL (PF) 1 % IJ SOLN
30.0000 mL | INTRAMUSCULAR | Status: DC | PRN
  Filled 2014-01-06: qty 30

## 2014-01-06 NOTE — H&P (Signed)
Patricia Franco is a 33 y.o. female presenting for labor IOL for controlled delivery with clean OR on standby due to strong fam hx Malignant hyperthermia  (mother, sister -severe and maternal uncle who died). Pt denies any complaints.  [facility], Dr Juliene Pina primary. Uncomplicated care and f/up. FamHx as above, pt never tested.  History OB History   Grav Para Term Preterm Abortions TAB SAB Ect Mult Living   Past Medical History  Diagnosis Date  . Miscarriage 2012, 2013  . Heart murmur   . Migraines   . Migraine headache with aura 10/06/2013  . Malignant hyperthermia     pt's mother  . Malignant hyperthermia    Past Surgical History  Procedure Laterality Date  . Mouth surgery     Family History: family history includes Other in her brother, brother, brother, maternal uncle, mother, sister, sister, sister, sister, sister, and sister. Social History:  reports that she has never smoked. She does not have any smokeless tobacco history on file. She reports that she does not drink alcohol or use illicit drugs.   Prenatal Transfer Tool  Maternal Diabetes: No Genetic Screening: Normal Maternal Ultrasounds/Referrals: Normal Fetal Ultrasounds or other Referrals:  None Maternal Substance Abuse:  No  Significant Maternal Medications:  None Significant Maternal Lab Results:  Lab values include: Group B Strep negative Other Comments:  strong fam hx of Malignant hyperthermia  ROS  Dilation: Closed Effacement (%): 50 Station: -2 Blood pressure 113/80, pulse 82, temperature 98.3 F (36.8 C), temperature source Oral, resp. rate 18, height  (1.702 m), weight 202 lb (91.627 kg), last menstrual period 04/01/2013, SpO2 99.00%. Exam Physical Exam  Physical exam:  A&O x 3, no acute distress. Pleasant HEENT neg, no thyromegaly Lungs CTA bilat CV RRR, S1S2 normal Abdo soft, non tender, non acute Extr no edema/ tenderness Pelvic above FHT  130s/ + accels/ no  decels/ mod variab- category I Toco none  Prenatal labs: ABO, Rh: O/Positive/-- (02/20 0000) Antibody: Negative (02/20 0000) Rubella: Immune (02/20 0000) RPR: Nonreactive (02/20 0000)  HBsAg: Negative (02/20 0000)  HIV: Non-reactive (02/20 0000)  GBS: Negative (08/18 0000)   Assessment/Plan: 33 yo, G3P0, with fam hx of malignant hyperthermia. Here for labor IOL so OR available as needed. Cytotex as tolerated. Anesthesia team aware, plan early epidural. Foley as needed. ambulate only if epidural non dense. FHT cat I. Spoke w Dr Jean Rosenthal, aware.   Patricia Franco 01/06/2014, 10:38 PM

## 2014-01-06 NOTE — Anesthesia Preprocedure Evaluation (Signed)
Anesthesia Evaluation  Patient identified by MRN, date of birth, ID band Patient awake    Reviewed: Allergy & Precautions, H&P , Patient's Chart, lab work & pertinent test results  History of Anesthesia Complications (+) MALIGNANT HYPERTHERMIA and history of anesthetic complications  Airway Mallampati: II TM Distance: >3 FB Neck ROM: full    Dental   Pulmonary  breath sounds clear to auscultation        Cardiovascular Rhythm:regular Rate:Normal     Neuro/Psych  Headaches,    GI/Hepatic   Endo/Other    Renal/GU      Musculoskeletal   Abdominal   Peds  Hematology   Anesthesia Other Findings   Reproductive/Obstetrics (+) Pregnancy                           Anesthesia Physical Anesthesia Plan  ASA: II  Anesthesia Plan: Epidural   Post-op Pain Management:    Induction:   Airway Management Planned:   Additional Equipment:   Intra-op Plan:   Post-operative Plan:   Informed Consent: I have reviewed the patients History and Physical, chart, labs and discussed the procedure including the risks, benefits and alternatives for the proposed anesthesia with the patient or authorized representative who has indicated his/her understanding and acceptance.     Plan Discussed with:   Anesthesia Plan Comments:         Anesthesia Quick Evaluation

## 2014-01-06 NOTE — Anesthesia Procedure Notes (Signed)
Epidural Patient location during procedure: OB Start time: 01/06/2014 10:15 PM  Staffing Anesthesiologist: Brayton Caves Performed by: anesthesiologist   Preanesthetic Checklist Completed: patient identified, site marked, surgical consent, pre-op evaluation, timeout performed, IV checked, risks and benefits discussed and monitors and equipment checked  Epidural Patient position: sitting Prep: site prepped and draped and DuraPrep Patient monitoring: continuous pulse ox and blood pressure Approach: midline Location: L3-L4 Injection technique: LOR air  Needle:  Needle type: Tuohy  Needle gauge: 17 G Needle length: 9 cm and 9 Needle insertion depth: 5 cm cm Catheter type: closed end flexible Catheter size: 19 Gauge Catheter at skin depth: 10 cm Test dose: negative  Assessment Events: blood not aspirated, injection not painful, no injection resistance, negative IV test and no paresthesia  Additional Notes Patient identified.  Risk benefits discussed including failed block, incomplete pain control, headache, nerve damage, paralysis, blood pressure changes, nausea, vomiting, reactions to medication both toxic or allergic, and postpartum back pain.  Patient expressed understanding and wished to proceed.  All questions were answered.  Sterile technique used throughout procedure and epidural site dressed with sterile barrier dressing. No paresthesia or other complications noted.The patient did not experience any signs of intravascular injection such as tinnitus or metallic taste in mouth nor signs of intrathecal spread such as rapid motor block. Please see nursing notes for vital signs.

## 2014-01-07 ENCOUNTER — Encounter (HOSPITAL_COMMUNITY): Payer: BC Managed Care – PPO | Admitting: Anesthesiology

## 2014-01-07 ENCOUNTER — Inpatient Hospital Stay (HOSPITAL_COMMUNITY): Payer: BC Managed Care – PPO | Admitting: Anesthesiology

## 2014-01-07 ENCOUNTER — Encounter (HOSPITAL_COMMUNITY): Payer: Self-pay

## 2014-01-07 LAB — ABO/RH: ABO/RH(D): O POS

## 2014-01-07 LAB — RPR

## 2014-01-07 MED ORDER — SENNOSIDES-DOCUSATE SODIUM 8.6-50 MG PO TABS
2.0000 | ORAL_TABLET | ORAL | Status: DC
  Administered 2014-01-07 – 2014-01-08 (×2): 2 via ORAL
  Filled 2014-01-07 (×2): qty 2

## 2014-01-07 MED ORDER — DIPHENHYDRAMINE HCL 25 MG PO CAPS: 25.0000 mg | ORAL_CAPSULE | Freq: Four times a day (QID) | ORAL | Status: DC | PRN

## 2014-01-07 MED ORDER — ONDANSETRON HCL 4 MG/2ML IJ SOLN: 4.0000 mg | INTRAMUSCULAR | Status: DC | PRN

## 2014-01-07 MED ORDER — BENZOCAINE-MENTHOL 20-0.5 % EX AERO
1.0000 "application " | INHALATION_SPRAY | CUTANEOUS | Status: DC | PRN
  Administered 2014-01-07: 1 via TOPICAL
  Filled 2014-01-07: qty 56

## 2014-01-07 MED ORDER — PRENATAL MULTIVITAMIN CH
1.0000 | ORAL_TABLET | Freq: Every day | ORAL | Status: DC
Start: 2014-01-07 — End: 2014-01-09
  Administered 2014-01-07 – 2014-01-08 (×2): 1 via ORAL
  Filled 2014-01-07 (×2): qty 1

## 2014-01-07 MED ORDER — OXYCODONE-ACETAMINOPHEN 5-325 MG PO TABS
1.0000 | ORAL_TABLET | ORAL | Status: DC | PRN
  Administered 2014-01-07 – 2014-01-08 (×2): 1 via ORAL
  Filled 2014-01-07 (×3): qty 1

## 2014-01-07 MED ORDER — SIMETHICONE 80 MG PO CHEW: 80.0000 mg | CHEWABLE_TABLET | ORAL | Status: DC | PRN

## 2014-01-07 MED ORDER — OXYCODONE-ACETAMINOPHEN 5-325 MG PO TABS: 2.0000 | ORAL_TABLET | ORAL | Status: DC | PRN

## 2014-01-07 MED ORDER — IBUPROFEN 600 MG PO TABS
600.0000 mg | ORAL_TABLET | Freq: Four times a day (QID) | ORAL | Status: DC
  Administered 2014-01-07 – 2014-01-09 (×8): 600 mg via ORAL
  Filled 2014-01-07 (×7): qty 1

## 2014-01-07 MED ORDER — ZOLPIDEM TARTRATE 5 MG PO TABS: 5.0000 mg | ORAL_TABLET | Freq: Every evening | ORAL | Status: DC | PRN

## 2014-01-07 MED ORDER — ONDANSETRON HCL 4 MG PO TABS: 4.0000 mg | ORAL_TABLET | ORAL | Status: DC | PRN

## 2014-01-07 MED ORDER — LANOLIN HYDROUS EX OINT: TOPICAL_OINTMENT | CUTANEOUS | Status: DC | PRN

## 2014-01-07 MED ORDER — FENTANYL 2.5 MCG/ML BUPIVACAINE 1/10 % EPIDURAL INFUSION (WH - ANES)
INTRAMUSCULAR | Status: DC | PRN
  Administered 2014-01-07: 14 mL/h via EPIDURAL

## 2014-01-07 MED ORDER — WITCH HAZEL-GLYCERIN EX PADS
1.0000 "application " | MEDICATED_PAD | CUTANEOUS | Status: DC | PRN
  Administered 2014-01-08: 1 via TOPICAL

## 2014-01-07 MED ORDER — TETANUS-DIPHTH-ACELL PERTUSSIS 5-2.5-18.5 LF-MCG/0.5 IM SUSP: 0.5000 mL | Freq: Once | INTRAMUSCULAR | Status: DC

## 2014-01-07 MED ORDER — DIBUCAINE 1 % RE OINT: 1.0000 "application " | TOPICAL_OINTMENT | RECTAL | Status: DC | PRN

## 2014-01-07 NOTE — Anesthesia Postprocedure Evaluation (Signed)
Anesthesia Post Note  Patient: Patricia Franco  Procedure(s) Performed: * No procedures listed *  Anesthesia type: Epidural  Patient location: Mother/Baby  Post pain: Pain level controlled  Post assessment: Post-op Vital signs reviewed  Last Vitals:  Filed Vitals:   01/07/14 1315  BP: 101/76  Pulse: 65  Temp: 36.7 C  Resp: 20    Post vital signs: Reviewed  Level of consciousness:alert  Complications: No apparent anesthesia complications

## 2014-01-07 NOTE — Progress Notes (Signed)
Patient ID: Patricia Franco, female   DOB: 06/11/1980, 33 y.o.   MRN: [id number] In room for AROM per request of Dr. Juliene Pina  S: Doing well, pain well-controlled with an epidural. (+) pelvic pressure with contractions.   OCeasar Mons Vitals:   01/07/14 0925 01/07/14 0929 01/07/14 0930 01/07/14 0931  BP:    129/77  Pulse: 72 73 73 86  Temp:      TempSrc:      Resp:      Height:      Weight:      SpO2: 100%   100%     FHT:  FHR: 130 bpm, variability: moderate,  accelerations:  Present,  decelerations:  Absent UC:   regular, every 1-2 minutes SVE:   Dilation: 9 Effacement (%): 100 Station: 0 Exam by:: Arita Miss Copious clear fluid  A / P: Induction of labor due to postterm,  progressing well on pitocin AROM   Fetal Wellbeing:  Category I Pain Control:  Epidural  Anticipated MOD:  NSVD  *Dr. Juliene Pina notified of status and membrane status  Patricia Franco, Patricia Lux MSN, CNM 01/07/2014, 9:34 AM

## 2014-01-07 NOTE — Lactation Note (Signed)
This note was copied from the chart of Patricia Jahnia Hewes. Lactation Consultation Note  P1,  Upon entering mother breastfeeding in relaxed cradle hold.  Added pillows for support and reviewed football. Mother has slightly pinched nipple.  Reviewed how to achieve a deeper latch. Reviewed cluster feeding.  Mom encouraged to feed baby 8-12 times/24 hours and with feeding cues.  Mom made aware of O/P services, breastfeeding support groups, community resources, and our phone # for post-discharge questions.    Patient Name: Patricia Franco [id number] Date: 01/07/2014 Reason for consult: Initial assessment   Maternal Data Formula Feeding for Exclusion: No Has patient been taught Hand Expression?: No Does the patient have breastfeeding experience prior to this delivery?: No  Feeding Feeding Type: Breast Fed  LATCH Score/Interventions Latch: Grasps breast easily, tongue down, lips flanged, rhythmical sucking.  Audible Swallowing: A few with stimulation Intervention(s): Skin to skin  Type of Nipple: Everted at rest and after stimulation  Comfort (Breast/Nipple): Soft / non-tender     Hold (Positioning): Assistance needed to correctly position infant at breast and maintain latch.  LATCH Score: 8  Lactation Tools Discussed/Used     Consult Status Consult Status: Follow-up Date: 01/08/14 Follow-up type: In-patient    Dahlia Byes [facility] 01/07/2014, 3:11 PM

## 2014-01-08 LAB — CBC
HCT: 32.9 % — ABNORMAL LOW (ref 36.0–46.0)
Hemoglobin: 11.1 g/dL — ABNORMAL LOW (ref 12.0–15.0)
MCH: 31.6 pg (ref 26.0–34.0)
MCHC: 33.7 g/dL (ref 30.0–36.0)
MCV: 93.7 fL (ref 78.0–100.0)
PLATELETS: 139 10*3/uL — AB (ref 150–400)
RBC: 3.51 MIL/uL — ABNORMAL LOW (ref 3.87–5.11)
RDW: 13.9 % (ref 11.5–15.5)
WBC: 11.2 10*3/uL — ABNORMAL HIGH (ref 4.0–10.5)

## 2014-01-08 MED ORDER — INFLUENZA VAC SPLIT QUAD 0.5 ML IM SUSY
0.5000 mL | PREFILLED_SYRINGE | INTRAMUSCULAR | Status: AC
  Administered 2014-01-08: 0.5 mL via INTRAMUSCULAR

## 2014-01-08 NOTE — Lactation Note (Signed)
This note was copied from the chart of Patricia Franco. Lactation Consultation Note  Patient Name: Patricia Franco [id number] Date: 01/08/2014 Reason for consult: Follow-up assessment Baby 31 hours of life. Mom reports nursing has improved, baby cluster-fed through the night, but is now nursing every couple of hours. Mom is using hand pump to evert nipples prior to latching baby. Mom able to latch baby to left breast, baby latched with lower lip turned in. Demonstrated how to tug chin to flange lower lip outward. Mom report increased comfort. Baby fell asleep at breast. Enc mom to continue nursing with cues and to call out for assistance as needed with latching.  Maternal Data    Feeding Feeding Type: Breast Fed Length of feed: 1 min  LATCH Score/Interventions Latch: Grasps breast easily, tongue down, lips flanged, rhythmical sucking. (Baby latched for 1 minute, baby sleepy.)  Audible Swallowing: None Intervention(s): Skin to skin  Type of Nipple: Everted at rest and after stimulation (Mom is using hand pump to evert nipple prior to latching baby.)  Comfort (Breast/Nipple): Soft / non-tender     Hold (Positioning): No assistance needed to correctly position infant at breast. Intervention(s): Breastfeeding basics reviewed;Support Pillows;Position options;Skin to skin  LATCH Score: 8  Lactation Tools Discussed/Used     Consult Status Consult Status: Follow-up Date: 01/09/14 Follow-up type: In-patient    Geralynn Ochs 01/08/2014, 6:31 PM

## 2014-01-08 NOTE — Progress Notes (Signed)
PPD 1 SVD  S:  Reports feeling well - plans DC tomorrow             Tolerating po/ No nausea or vomiting             Bleeding is light             Pain controlled with motrin             Up ad lib / ambulatory / voiding QS  Newborn breast feeding   O:               VS: BP 116/74  Pulse 72  Temp(Src) 98.5 F (36.9 C) (Oral)  Resp 18  Ht  (1.702 m)  Wt 91.627 kg (202 lb)  BMI 31.63 kg/m2  SpO2 99%  LMP 04/01/2013  Breastfeeding? Unknown   LABS:              Recent Labs  01/06/14 2100 01/08/14 0545  WBC 9.8 11.2*  HGB 12.1 11.1*  PLT 178 139*               Blood type: --/--/O POS (09/20 2100)  Rubella: Immune (02/20 0000)                     I&O: Intake/Output     09/21 0701 - 09/22 0700 09/22 0701 - 09/23 0700   Urine (mL/kg/hr) 450 (0.2)    Blood 200 (0.1)    Total Output 650     Net -650          Urine Occurrence 1 x                  Physical Exam:             Alert and oriented X3  Abdomen: soft, non-tender, non-distended              Fundus: firm, non-tender, U-1  Perineum: no edema  Lochia: spotting  Extremities: no edema, no calf pain or tenderness    A: PPD # 1   Doing well - stable status  P: Routine post partum orders  Anticipate DC in am  Marlinda Mike CNM, MSN, [facility] 01/08/2014, 10:24 AM

## 2014-01-09 MED ORDER — OXYCODONE-ACETAMINOPHEN 5-325 MG PO TABS: 1.0000 | ORAL_TABLET | ORAL | Status: DC | PRN

## 2014-01-09 MED ORDER — IBUPROFEN 600 MG PO TABS: 600.0000 mg | ORAL_TABLET | Freq: Four times a day (QID) | ORAL | Status: DC

## 2014-01-09 NOTE — Discharge Summary (Signed)
Obstetric Discharge Summary Reason for Admission: induction of labor for clean OR d/t strong family hx of malignant hyperthermia Prenatal Course: uncomplicated Intrapartum Procedures: spontaneous vaginal delivery Postpartum Procedures: none Complications-Operative and Postpartum: 1st degree perineal laceration Hemoglobin  Date Value Ref Range Status  01/08/2014 11.1* 12.0 - 15.0 g/dL Final     HCT  Date Value Ref Range Status  01/08/2014 32.9* 36.0 - 46.0 % Final    Physical Exam:  General: alert and cooperative Lochia: appropriate Uterine Fundus: firm Incision: healing well, no significant drainage, no dehiscence, no significant erythema DVT Evaluation: No evidence of DVT seen on physical exam. Negative Homan's sign. No cords or calf tenderness. No significant calf/ankle edema.  Discharge Diagnoses: Term Pregnancy-delivered  Discharge Information: Date: 01/09/2014 Activity: pelvic rest Diet: routine Medications: PNV, Ibuprofen and Percocet Condition: stable Instructions: refer to practice specific booklet Discharge to: home Follow-up Information   Follow up with MODY,VAISHALI R, MD. Schedule an appointment as soon as possible for a visit in 6 weeks.   Specialty:  Obstetrics and Gynecology   Contact information:   [address] [place] [address] [phone]       Newborn Data: Live born female on [birth year] Birth Weight: 7 lb 15.3 oz (3609 g) APGAR: 9, 9  Home with mother.  Raheen Capili, N 01/09/2014, 11:26 AM

## 2014-01-09 NOTE — Lactation Note (Signed)
This note was copied from the chart of Patricia Franco. Lactation Consultation Note  Patient Name: Patricia Franco [id number] Date: 01/09/2014 Reason for consult: Follow-up assessment Per mom breast feeding is going well both breast. Per mom nipples tender, already has comfort gels. LC reviewed basics for latching , sore nipple and engorgement prevention and tx if needed , referring  To the Baby and me booklet. LC observed baby already latched with depth , consistent pattern with multiply  Swallows. When baby released it had been 25 mins , and moms nipple appeared normal shape. Mother informed of post-discharge support and given phone number to the lactation department, including services for  phone call assistance; out-patient appointments; and breastfeeding support group. List of other breastfeeding resources  in the community given in the handout. Encouraged mother to call for problems or concerns related to breastfeeding.   Maternal Data Has patient been taught Hand Expression?:  (reviewed with mom , )  Feeding Feeding Type: Breast Fed Length of feed: 25 min (LC observed baby already latched with multiply swallows )  LATCH Score/Interventions Latch:  (latched with depth )  Audible Swallowing:  (multiply swallows noted )  Type of Nipple:  (nipple appeared normal when baby released )  Comfort (Breast/Nipple):  (milk is in , not engorged , just full )     Hold (Positioning):  (mom independent with latch ) Intervention(s): Breastfeeding basics reviewed;Support Pillows     Lactation Tools Discussed/Used     Consult Status Consult Status: Complete Date: 01/09/14    Kathrin Greathouse 01/09/2014, 9:53 AM

## 2014-01-09 NOTE — Progress Notes (Signed)
PPD #2- SVD  Subjective:   Reports feeling well Tolerating po/ No nausea or vomiting Bleeding is light Pain controlled with Motrin and Percocet Up ad lib / ambulatory / voiding without problems Newborn: breastfeeding     Objective:   VS: VS:  Filed Vitals:   01/07/14 2245 01/08/14 0610 01/08/14 1712 01/09/14 0558  BP: 127/84 116/74 112/74 120/68  Pulse: 65 72 73 77  Temp: 97.7 F (36.5 C) 98.5 F (36.9 C) 98.2 F (36.8 C) 98 F (36.7 C)  TempSrc: Oral Oral Oral Oral  Resp: Height:      Weight:      SpO2:        LABS:  Recent Labs  01/06/14 2100 01/08/14 0545  WBC 9.8 11.2*  HGB 12.1 11.1*  PLT 178 139*   Blood type: --/--/O POS (09/20 2100) Rubella: Immune (02/20 0000)                I&O: Intake/Output     09/22 0701 - 09/23 0700 09/23 0701 - 09/24 0700   Urine (mL/kg/hr)     Blood     Total Output       Net              Physical Exam: Alert and oriented X3 Abdomen: soft, non-tender, non-distended  Fundus: firm, non-tender, U-1 Perineum: Well approximated, no significant erythema, edema, or drainage; healing well. Lochia: small Extremities: No edema, no calf pain or tenderness    Assessment: PPD # 2 G3P1021/ S/P:induced vaginal, 1st degree laceration Doing well - stable for discharge home   Plan: Discharge home RX's:  Ibuprofen  po Q 6 hrs prn pain #30 Refill x 0 Percocet 5/325 1 to 2 po Q 4 hrs prn pain #30 Refill x 0 Routine pp visit in [employer] booklet given    Donette Larry, N MSN, CNM 01/09/2014, 9:07 AM

## 2014-02-03 NOTE — Discharge Summary (Signed)
Reviewed and agree with note and plan. V.Aminta Sakurai, MD  

## 2014-02-11 ENCOUNTER — Ambulatory Visit (HOSPITAL_COMMUNITY)
Admission: RE | Admit: 2014-02-11 | Discharge: 2014-02-11 | Disposition: A | Discharge status: Home / Self Care | Payer: BC Managed Care – PPO | Source: Ambulatory Visit | Attending: Obstetrics & Gynecology | Admitting: Obstetrics & Gynecology

## 2014-02-11 NOTE — Lactation Note (Signed)
Lactation Consult  Mother's reason for visit:  Difficult latch, poor milk supply Consult:  Initial Lactation Consultant:  Patricia Franco, Patricia Franco  ________________________________________________________________________  Patricia FloresBaby's Name: Patricia Franco  Date of Birth: 01/07/2013  Pediatrician: Patricia Franco'Patricia Franco Gender: female  Gestational Age: 6372w1d (At Birth)  Birth Weight: 7 lb 15.3 oz (3609 g)  Weight at Discharge: Weight: 7 lb 9.7 oz (3450 g) Date of Discharge: 01/09/2014  [facility]   01/07/14 1025 01/08/14 0027 01/08/14 2353  Weight: 7 lb 15.3 oz (3609 g) 7 lb 13.9 oz (3570 g) 7 lb 9.7 oz (3450 g)  Last weight (Per Mom) 10 lbs  Location:Pediatrician's office  Weight today: 10 lbs 15.5 oz   ________________________________________________________________________  Mother's Name: Patricia Franco Type of delivery:  C/S Breastfeeding Experience: none prior to this baby Maternal Medical Conditions: 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  Color:  Clear yellow Stools:  5 in 24 hrs.  Color:  Yellow  ________________________________________________________________________  Maternal Breast Assessment  Breast:  Soft Nipple:  Erect Pain level:  0   _______________________________________________________________________ Feeding Assessment/Evaluation  Initial feeding assessment:  Positioning:  Franco cradle Left breast  LATCH  documentation:  Latch:  2 = Grasps breast easily, tongue down, lips flanged, rhythmical sucking.  Audible swallowing:  1 = A few with stimulation  Type of nipple:  2 = Everted at rest and after stimulation  Comfort (Breast/Nipple):  2 = Soft / non-tender  Hold (Positioning):  1 = Assistance needed to correctly position infant at breast and maintain latch  LATCH score:  8  Attached assessment:  Shallow  Lips flanged:  Yes.    Lips untucked:  Yes.    Suck assessment:  Displays both  Assited mother with latching baby deeper to breast, guiding baby with an asymmetrical latch to get chin into breast. Mother has bruised nipples but denies discomfort. Baby is "chomping" when feeding, occasional swallows, falls a sleep and mother stimulates baby to continue to feed. Breast mostly soft but mother can hand express milk.  Pre-feed weight:  4976 g   Post-feed weight:  4994 g  Amount transferred:  18 ml   Additional Feeding Assessment -   Infant's oral assessment: Patricia Franco does not extend her tongue under the breast. She latches shallow without the shield. She can touch the roof of her mouth with her tongue, has lateral movement but "bites" in a "chomping" motion on a gloved finger. Nipple shield was helpful with keeping tongue down and baby engaged in the feeding. Milk was noted in the shield following the feeding.   Positioning:  Franco cradle Right breast  LATCH documentation:  Latch:  2 = Grasps breast easily, tongue down, lips flanged, rhythmical sucking.  Audible swallowing:  2 = Spontaneous and intermittent  Type of nipple:  2 = Everted at rest and after stimulation  Comfort (Breast/Nipple):  2 = Soft / non-tender  Hold (Positioning):  2 = No assistance needed to correctly position infant  at breast  LATCH score:  10 (with nipple shield)  Attached assessment:  Deep latch after nipple shield applied  Lips flanged:  Yes.    Lips untucked:  Yes.    Suck assessment:  Displays both  Tools:   Nipple shield 24 mm Instructed on use and cleaning of tool:  Yes.    Pre-feed weight:  4994 g   Post-feed weight:  5030 g Amount transferred:  36 ml Amount supplemented:  Mother will feed baby at home at breast or expressed milk as needed. Baby was content after the feeding.  Total amount transferred:  54 ml  Mother requested appointment due to baby feeding poorly at the breast and concerns of milk volume reduction. She states baby was feeding fair at the breast but latch was painful. Nipples were bruised, comfort gels helped with healing. She reports that her baby has always "chomped" with feeding. Mother states her husband has a "tongue tie" and she feels her baby has one too. Oral assessment is suggestive that posterior frenula is tight. Baby is being supplemented with expressed milk due to increase fussiness after feedings and not acting content. Mother said her milk supply has reduced from pumping 5-6 oz q 3 hours to 2 oz. Baby has had all breast milk until yesterday when formula given. Mother breast feeds ad lib but baby falls asleep at the breast. Mother reports baby tolerates a bottle of 60-120 ml of expressed milk in approx. 40 minutes. Nipple shield was placed on the breast after instructions given and baby fed much better transferring 36 ml. Discussions about evaluation of frenula by ENT/ DS for revision but mother does not want any potential surgerical procedure due to infant's potential for genetic malignant hyperthermia and the risk involved. Mother plans to feed at the breast, supplement as needed, continue to pump, and attend breastfeeding support groups for weight checks and support. Baby is thriving and having steady weight gain.

## 2014-02-18 ENCOUNTER — Encounter (HOSPITAL_COMMUNITY): Payer: Self-pay

## 2014-07-10 ENCOUNTER — Other Ambulatory Visit (HOSPITAL_COMMUNITY): Payer: Self-pay

## 2018-06-02 ENCOUNTER — Encounter (HOSPITAL_BASED_OUTPATIENT_CLINIC_OR_DEPARTMENT_OTHER): Payer: Self-pay

## 2018-06-02 ENCOUNTER — Other Ambulatory Visit: Payer: Self-pay

## 2018-06-02 ENCOUNTER — Emergency Department (HOSPITAL_BASED_OUTPATIENT_CLINIC_OR_DEPARTMENT_OTHER)
Admission: EM | Admit: 2018-06-02 | Discharge: 2018-06-02 | Disposition: A | Discharge status: Home / Self Care | Payer: BLUE CROSS/BLUE SHIELD | Attending: Emergency Medicine | Admitting: Emergency Medicine

## 2018-06-02 DIAGNOSIS — T7840XA Allergy, unspecified, initial encounter: Secondary | ICD-10-CM | POA: Insufficient documentation

## 2018-06-02 DIAGNOSIS — Z79899 Other long term (current) drug therapy: Secondary | ICD-10-CM | POA: Insufficient documentation

## 2018-06-02 MED ORDER — PREDNISONE 50 MG PO TABS
60.0000 mg | ORAL_TABLET | Freq: Once | ORAL | Status: AC
  Administered 2018-06-02: 60 mg via ORAL
  Filled 2018-06-02: qty 1

## 2018-06-02 MED ORDER — PREDNISONE 20 MG PO TABS: ORAL_TABLET | ORAL | 0 refills | Status: DC

## 2018-06-02 NOTE — ED Triage Notes (Signed)
Pt comes in by EMS for allergic reaction that started at 1000 this morning with hives and itching. It progressed to chest tightness and SOB and she went to Urgent Care where she received 25 PO benadryl and 0.3 IM epi. UC sent her here for evaluation. No hives, no SOB, no oral swelling and no wheezing present

## 2018-06-02 NOTE — ED Notes (Signed)
ED Provider at bedside. 

## 2018-06-02 NOTE — ED Notes (Signed)
Pt denies any current complaints at this time, there are no signs of respiratory distress or anaphylaxis. She verbalizes understanding of dc instructions and denies any further needs at this time.

## 2018-06-02 NOTE — ED Provider Notes (Signed)
[facility] EMERGENCY DEPARTMENT Provider Note   CSN: [id number] Arrival date & time: 06/02/18  1508     History   Chief Complaint Chief Complaint  Patient presents with  . Allergic Reaction    HPI Patricia Franco is a 38 y.o. female.  38 yo F with a chief complaint of allergic reaction.  The patient was getting a massage and felt like her skin was burning after the lotion was applied to her skin.  She went home and felt like she was having worsening swelling and fell like her throat was closing went to urgent care and got a dose of epinephrine and then was sent to the ED.  She got epinephrine about an hour and 15 minutes ago.  She has had complete resolution of her symptoms.  Denies any recurrence denies vomiting or diarrhea denies feeling like she may pass out.  Has an allergy to bee stings but no known allergy to Oils.  She has an active prescription for an EpiPen and does not need a refill.  The history is provided by the patient.  Allergic Reaction  Presenting symptoms: difficulty breathing, itching and rash   Presenting symptoms: no wheezing   Severity:  Moderate Duration:  2 hours Prior allergic episodes:  Insect allergies Context comment:  Oil from massage Relieved by:  Epinephrine Worsened by:  Nothing Ineffective treatments:  None tried   Past Medical History:  Diagnosis Date  . Heart murmur   . Malignant hyperthermia    pt's mother  . Malignant hyperthermia   . Migraine headache with aura 10/06/2013  . Migraines   . Miscarriage 2012, 2013  . Postpartum care following vaginal delivery (9/21) 01/07/2014    Patient Active Problem List   Diagnosis Date Noted  . SVD (spontaneous vaginal delivery) 01/07/2014  . Postpartum care following vaginal delivery (9/21) 01/07/2014  . Pregnancy 01/06/2014  . Migraine headache with aura 10/06/2013    Past Surgical History:  Procedure Laterality Date  . MOUTH SURGERY       OB History    Gravida  3   Para  1   Term  1   Preterm      AB  2   Living  1     SAB  2   TAB      Ectopic      Multiple      Live Births  1            Home Medications    Prior to Admission medications   Medication Sig Start Date End Date Taking? Authorizing Provider  EPINEPHrine (EPIPEN IJ) Inject 1 each as directed as needed (for anaphylaxis).    [provider]  ibuprofen (ADVIL,MOTRIN) 600 MG tablet Take 1 tablet (600 mg total) by mouth every 6 (six) hours. 01/09/14   Donette Larry, CNM  oxyCODONE-acetaminophen (PERCOCET/ROXICET) 5-325 MG per tablet Take 1-2 tablets by mouth every 4 (four) hours as needed (for pain scale equal to or greater than 7). 01/09/14   Donette Larry, CNM  predniSONE (DELTASONE) 20 MG tablet 2 tabs po daily x 4 days 06/02/18   Melene Plan, DO  Prenatal Vit-Fe Fumarate-FA (PRENATAL MULTIVITAMIN) TABS tablet Take 1 tablet by mouth daily.    [provider]    Family History Family History  Problem Relation Age of Onset  . Other Mother        malignant hyperthermia  . Other Sister        malignant  hyperthermia  . Other Brother        malignant hyperthermia  . Other Maternal Uncle        malignant hyperthermia  . Other Sister        malignant hyperthermia  . Other Sister        malignant hyperthermia  . Other Sister        malignant hyperthermia  . Other Sister        malignant hyperthermia  . Other Brother        malignant hyperthermia  . Other Brother        malignant hyperthermia  . Other Sister        malignant hyperthermia    Social History Social History   Tobacco Use  . Smoking status: Never Smoker  Substance Use Topics  . Alcohol use: No  . Drug use: No     Allergies   Bee venom; Penicillins; and Penicillins   Review of Systems Review of Systems  Constitutional: Negative for chills and fever.  HENT: Negative for congestion and rhinorrhea.   Eyes: Negative for redness and visual disturbance.  Respiratory:  Positive for shortness of breath. Negative for wheezing.   Cardiovascular: Negative for chest pain and palpitations.  Gastrointestinal: Negative for nausea and vomiting.  Genitourinary: Negative for dysuria and urgency.  Musculoskeletal: Negative for arthralgias and myalgias.  Skin: Positive for itching and rash. Negative for pallor and wound.  Neurological: Negative for dizziness and headaches.     Physical Exam Updated Vital Signs BP 114/68 (BP Location: Right Arm)   Pulse 84   Temp 98 F (36.7 C) (Oral)   Resp 18   Ht 5\' 8"  (1.727 m)   Wt 88.5 kg   SpO2 100%   BMI 29.65 kg/m   Physical Exam Vitals signs and nursing note reviewed.  Constitutional:      General: She is not in acute distress.    Appearance: She is well-developed. She is not diaphoretic.  HENT:     Head: Normocephalic and atraumatic.     Comments: No noted posterior oropharyngeal erythema edema.  Tolerating secretions without difficulty. Eyes:     Pupils: Pupils are equal, round, and reactive to light.  Neck:     Musculoskeletal: Normal range of motion and neck supple.  Cardiovascular:     Rate and Rhythm: Normal rate and regular rhythm.     Heart sounds: No murmur. No friction rub. No gallop.   Pulmonary:     Effort: Pulmonary effort is normal.     Breath sounds: No wheezing or rales.  Abdominal:     General: There is no distension.     Palpations: Abdomen is soft.     Tenderness: There is no abdominal tenderness.  Musculoskeletal:        General: No tenderness.  Skin:    General: Skin is warm and dry.  Neurological:     Mental Status: She is alert and oriented to person, place, and time.  Psychiatric:        Behavior: Behavior normal.      ED Treatments / Results  Labs (all labs ordered are listed, but only abnormal results are displayed) Labs Reviewed - No data to display  EKG None  Radiology No results found.  Procedures Procedures (including critical care time)  Medications  Ordered in ED Medications  predniSONE (DELTASONE) tablet 60 mg (has no administration in time range)     Initial Impression / Assessment and Plan / ED  Course  I have reviewed the triage vital signs and the nursing notes.  Pertinent labs & imaging results that were available during my care of the patient were reviewed by me and considered in my medical decision making (see chart for details).     38 yo F with what sounds like an allergic reaction, this is resolved completely with epinephrine and has not had a recurrence.  She was not given steroids at urgent care.  We will give a dose here and started on burst dose.  I offered to observe the patient here, she promises to return for worsening.  Have her take H1 blockers for the next 48 hours.  PCP follow-up.   3:27 PM:  I have discussed the diagnosis/risks/treatment options with the patient and believe the pt to be eligible for discharge home to follow-up with PCP. We also discussed returning to the ED immediately if new or worsening sx occur. We discussed the sx which are most concerning (e.g., sudden worsening pain, fever, inability to tolerate by mouth) that necessitate immediate return. Medications administered to the patient during their visit and any new prescriptions provided to the patient are listed below.  Medications given during this visit Medications  predniSONE (DELTASONE) tablet 60 mg (has no administration in time range)     The patient appears reasonably screen and/or stabilized for discharge and I doubt any other medical condition or other [facility] requiring further screening, evaluation, or treatment in the ED at this time prior to discharge.     Final Clinical Impressions(s) / ED Diagnoses   Final diagnoses:  Allergic reaction, initial encounter    ED Discharge Orders         Ordered    predniSONE (DELTASONE) 20 MG tablet     06/02/18 1524           Melene Plan, DO 06/02/18 1527

## 2018-06-02 NOTE — Discharge Instructions (Addendum)
You can take Benadryl around-the-clock for the next couple days or Zyrtec 2 tablets a day. If you have recurrent symptoms and feel you are having trouble breathing please use your epinephrine pen and return to the emergency department for evaluation.

## 2020-03-10 ENCOUNTER — Other Ambulatory Visit: Payer: Self-pay | Admitting: Obstetrics and Gynecology

## 2020-03-10 DIAGNOSIS — N63 Unspecified lump in unspecified breast: Secondary | ICD-10-CM

## 2020-04-07 ENCOUNTER — Other Ambulatory Visit: Payer: BLUE CROSS/BLUE SHIELD

## 2020-04-09 ENCOUNTER — Other Ambulatory Visit: Payer: Self-pay

## 2020-04-09 ENCOUNTER — Other Ambulatory Visit: Payer: Self-pay | Admitting: Obstetrics and Gynecology

## 2020-04-09 DIAGNOSIS — N63 Unspecified lump in unspecified breast: Secondary | ICD-10-CM

## 2020-04-23 ENCOUNTER — Ambulatory Visit
Admission: RE | Admit: 2020-04-23 | Discharge: 2020-04-23 | Disposition: A | Discharge status: Home / Self Care | Payer: BLUE CROSS/BLUE SHIELD | Source: Ambulatory Visit | Attending: Obstetrics and Gynecology | Admitting: Obstetrics and Gynecology

## 2020-04-23 ENCOUNTER — Other Ambulatory Visit: Payer: Self-pay

## 2020-04-23 ENCOUNTER — Other Ambulatory Visit: Payer: BLUE CROSS/BLUE SHIELD

## 2020-04-23 DIAGNOSIS — N63 Unspecified lump in unspecified breast: Secondary | ICD-10-CM

## 2020-04-23 MED ORDER — GADOBUTROL 1 MMOL/ML IV SOLN
10.0000 mL | Freq: Once | INTRAVENOUS | Status: AC | PRN
  Administered 2020-04-23: 10 mL via INTRAVENOUS

## 2020-04-25 ENCOUNTER — Other Ambulatory Visit: Payer: Self-pay | Admitting: Obstetrics and Gynecology

## 2020-04-25 DIAGNOSIS — N6320 Unspecified lump in the left breast, unspecified quadrant: Secondary | ICD-10-CM

## 2020-04-28 ENCOUNTER — Other Ambulatory Visit: Payer: BLUE CROSS/BLUE SHIELD

## 2020-04-28 ENCOUNTER — Other Ambulatory Visit: Payer: Self-pay | Admitting: Obstetrics and Gynecology

## 2020-04-28 DIAGNOSIS — R9389 Abnormal findings on diagnostic imaging of other specified body structures: Secondary | ICD-10-CM

## 2020-05-07 ENCOUNTER — Ambulatory Visit
Admission: RE | Admit: 2020-05-07 | Discharge: 2020-05-07 | Disposition: A | Discharge status: Home / Self Care | Payer: BLUE CROSS/BLUE SHIELD | Source: Ambulatory Visit | Attending: Obstetrics and Gynecology | Admitting: Obstetrics and Gynecology

## 2020-05-07 ENCOUNTER — Other Ambulatory Visit (HOSPITAL_COMMUNITY): Payer: Self-pay | Admitting: Diagnostic Radiology

## 2020-05-07 ENCOUNTER — Other Ambulatory Visit: Payer: Self-pay

## 2020-05-07 DIAGNOSIS — R9389 Abnormal findings on diagnostic imaging of other specified body structures: Secondary | ICD-10-CM

## 2020-05-07 MED ORDER — GADOBUTROL 1 MMOL/ML IV SOLN
8.0000 mL | Freq: Once | INTRAVENOUS | Status: AC | PRN
  Administered 2020-05-07: 8 mL via INTRAVENOUS

## 2020-05-17 ENCOUNTER — Other Ambulatory Visit: Payer: BLUE CROSS/BLUE SHIELD

## 2020-07-28 DIAGNOSIS — M4722 Other spondylosis with radiculopathy, cervical region: Secondary | ICD-10-CM | POA: Diagnosis not present

## 2020-07-31 DIAGNOSIS — M5412 Radiculopathy, cervical region: Secondary | ICD-10-CM | POA: Diagnosis not present

## 2020-09-24 ENCOUNTER — Other Ambulatory Visit: Payer: Self-pay | Admitting: Obstetrics and Gynecology

## 2020-09-24 DIAGNOSIS — N6489 Other specified disorders of breast: Secondary | ICD-10-CM

## 2020-09-24 DIAGNOSIS — N6011 Diffuse cystic mastopathy of right breast: Secondary | ICD-10-CM

## 2020-09-26 DIAGNOSIS — F419 Anxiety disorder, unspecified: Secondary | ICD-10-CM | POA: Diagnosis not present

## 2020-10-08 ENCOUNTER — Other Ambulatory Visit: Payer: BLUE CROSS/BLUE SHIELD

## 2020-10-11 DIAGNOSIS — U071 COVID-19: Secondary | ICD-10-CM | POA: Diagnosis not present

## 2020-10-16 ENCOUNTER — Other Ambulatory Visit: Payer: BLUE CROSS/BLUE SHIELD

## 2020-11-06 ENCOUNTER — Ambulatory Visit
Admission: RE | Admit: 2020-11-06 | Discharge: 2020-11-06 | Disposition: A | Discharge status: Home / Self Care | Payer: BLUE CROSS/BLUE SHIELD | Source: Ambulatory Visit | Attending: Obstetrics and Gynecology | Admitting: Obstetrics and Gynecology

## 2020-11-06 DIAGNOSIS — N6011 Diffuse cystic mastopathy of right breast: Secondary | ICD-10-CM

## 2020-11-06 DIAGNOSIS — N6489 Other specified disorders of breast: Secondary | ICD-10-CM

## 2020-11-06 DIAGNOSIS — Z803 Family history of malignant neoplasm of breast: Secondary | ICD-10-CM | POA: Diagnosis not present

## 2020-11-06 MED ORDER — GADOBUTROL 1 MMOL/ML IV SOLN
10.0000 mL | Freq: Once | INTRAVENOUS | Status: AC | PRN
  Administered 2020-11-06: 10 mL via INTRAVENOUS

## 2020-11-18 DIAGNOSIS — N6452 Nipple discharge: Secondary | ICD-10-CM | POA: Diagnosis not present

## 2020-11-18 DIAGNOSIS — Z803 Family history of malignant neoplasm of breast: Secondary | ICD-10-CM | POA: Diagnosis not present

## 2020-11-21 ENCOUNTER — Telehealth: Payer: Self-pay | Admitting: Genetic Counselor

## 2020-11-21 NOTE — Telephone Encounter (Signed)
Received a genetic counseling referral from Dr. Luisa Hart for fhx of breast cancer. Ms. Patricia Franco returned my call and has been scheduled to see Irving Burton on 8/16 at 2pm. Pt aware to arrive 20 minutes early.

## 2020-11-25 ENCOUNTER — Other Ambulatory Visit: Payer: Self-pay | Admitting: Surgery

## 2020-11-25 DIAGNOSIS — N6452 Nipple discharge: Secondary | ICD-10-CM | POA: Diagnosis not present

## 2020-11-28 DIAGNOSIS — F419 Anxiety disorder, unspecified: Secondary | ICD-10-CM | POA: Diagnosis not present

## 2020-12-02 ENCOUNTER — Inpatient Hospital Stay: Payer: BLUE CROSS/BLUE SHIELD | Admitting: Genetic Counselor

## 2020-12-02 ENCOUNTER — Encounter: Payer: Self-pay | Admitting: Surgery

## 2020-12-02 ENCOUNTER — Inpatient Hospital Stay: Payer: BLUE CROSS/BLUE SHIELD

## 2020-12-17 ENCOUNTER — Encounter: Payer: Self-pay | Admitting: Genetic Counselor

## 2020-12-23 ENCOUNTER — Telehealth: Payer: Self-pay | Admitting: Licensed Clinical Social Worker

## 2020-12-23 NOTE — Telephone Encounter (Signed)
Moved patient appointment to my schedule for a virtual visit 9/7 at 9 am.

## 2020-12-24 ENCOUNTER — Inpatient Hospital Stay: Payer: BC Managed Care – PPO | Attending: Genetic Counselor | Admitting: Licensed Clinical Social Worker

## 2020-12-24 ENCOUNTER — Other Ambulatory Visit: Payer: Self-pay

## 2020-12-24 ENCOUNTER — Encounter: Payer: Self-pay | Admitting: Licensed Clinical Social Worker

## 2020-12-24 ENCOUNTER — Inpatient Hospital Stay: Payer: BC Managed Care – PPO

## 2020-12-24 ENCOUNTER — Encounter: Payer: BLUE CROSS/BLUE SHIELD | Admitting: Genetic Counselor

## 2020-12-24 DIAGNOSIS — Z808 Family history of malignant neoplasm of other organs or systems: Secondary | ICD-10-CM

## 2020-12-24 DIAGNOSIS — Z800 Family history of malignant neoplasm of digestive organs: Secondary | ICD-10-CM | POA: Diagnosis not present

## 2020-12-24 DIAGNOSIS — Z803 Family history of malignant neoplasm of breast: Secondary | ICD-10-CM | POA: Diagnosis not present

## 2020-12-24 DIAGNOSIS — Z801 Family history of malignant neoplasm of trachea, bronchus and lung: Secondary | ICD-10-CM

## 2020-12-24 DIAGNOSIS — Z8041 Family history of malignant neoplasm of ovary: Secondary | ICD-10-CM | POA: Diagnosis not present

## 2020-12-24 NOTE — Progress Notes (Signed)
REFERRING PROVIDER: Erroll Luna, MD [address] [place],  [place] [address]  PRIMARY PROVIDER:  Bartholome Bill, MD  PRIMARY REASON FOR VISIT:  1. Family history of breast cancer   2. Family history of ovarian cancer   3. Family history of thyroid cancer   4. Family history of colon cancer   5. Family history of lung cancer    I connected with Patricia Franco on 12/24/2020 at 9:00 AM EDT by MyChart video conference and verified that I am speaking with the correct person using two identifiers.    Patient location: home Provider location: [place]:   Patricia Franco, a 40 y.o. female, was seen for a [facility] cancer genetics consultation at the request of Dr. Brantley Stage due to a family history of cancer.  Patricia Franco presents to clinic today to discuss the possibility of a hereditary predisposition to cancer, genetic testing, and to further clarify her future cancer risks, as well as potential cancer risks for family members.   Patricia Franco is a 40 y.o. female with no personal history of cancer.  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 She also underwent right breast excision for issues with drainage that was benign.  CANCER HISTORY:  Oncology History   No history exists.     RISK FACTORS:  Menarche was at age 19-18.  First live birth at age 74.  OCP use for approximately 2 years.  Ovaries intact: yes.  Hysterectomy: no. Menopausal status: premenopausal.  HRT use: 0 years. Colonoscopy: no; not examined. Mammogram within the last year: yes. Number of breast biopsies: 2. Up to date with pelvic exams: yes. Any excessive radiation exposure in the past: no  Past Medical History:  Diagnosis Date   Family history of breast cancer    Family history of colon cancer    Family  history of lung cancer    Family history of ovarian cancer    Family history of thyroid cancer    Heart murmur    Malignant hyperthermia    pt's mother   Malignant hyperthermia    Migraine headache with aura 10/06/2013   Migraines    Miscarriage 2012, 2013   Postpartum care following vaginal delivery (9/21) 01/07/2014    Past Surgical History:  Procedure Laterality Date   MOUTH SURGERY      Social History   Socioeconomic History   Marital status: Married    Spouse name: Not on file   Number of children: Not on file   Years of education: Not on file   Highest education level: Not on file  Occupational History   Not on file  Tobacco Use   Smoking status: Never   Smokeless tobacco: Not on file  Substance and Sexual Activity   Alcohol use: No   Drug use: No   Sexual activity: Yes    Birth control/protection: None  Other Topics Concern   Not on file  Social History Narrative   ** Merged History Encounter **       Social Determinants of Health   Financial Resource Strain: Not on file  Food Insecurity: Not on file  Transportation Needs: Not on file  Physical Activity: Not on file  Stress: Not on file  Social Connections: Not on file     FAMILY HISTORY:  We obtained a  detailed, 4-generation family history.  Significant diagnoses are listed below: Family History  Problem Relation Age of Onset   Malignant hyperthermia Mother    Deep vein thrombosis Mother    Obesity Father    Malignant hyperthermia Sister    Thyroid cancer Sister 39       papillary   Deep vein thrombosis Sister    Malignant hyperthermia Sister    Malignant hyperthermia Sister    Malignant hyperthermia Sister    Malignant hyperthermia Sister    Malignant hyperthermia Sister    Malignant hyperthermia Brother    Malignant hyperthermia Brother    Malignant hyperthermia Brother    Breast cancer Maternal Aunt 71   Ovarian cancer Maternal Aunt 71   Malignant hyperthermia Maternal Uncle    Lung  cancer Maternal Grandmother    Lung cancer Maternal Grandfather    Throat cancer Maternal Grandfather    Esophageal cancer Maternal Grandfather    Heart attack Paternal Grandmother    Colon cancer Paternal Grandfather    Patricia Franco had 4 brothers and 6 sisters. One brother died at birth. One of her sisters had papillary thyroid cancer at 84 and is living at 48.   Patricia Franco mother is living at 14, no cancers. Patient had 1 maternal uncle, 2 maternal aunts. One aunt had breast and ovarian cancer at 22 and died at 30. No known cancers in maternal cousins. Maternal grandmother had lung cancer in her mid 66s and was a heavy smoker, she passed from this. Maternal grandfather also was a heavy smoker and had lung, throat and esophageal cancer and died in his mid 3s as well.  Patricia Franco father is living at 51. Patient has 2 paternal uncles, no cancers for either of them or for their children. Paternal grandfather had colon cancer. Grandmother passed at 44.  Patricia Franco is unaware of previous family history of genetic testing for hereditary cancer risks. Patient's maternal ancestors are of [place] and [place] descent, and paternal ancestors are of [place] and [place] descent. There is no reported Ashkenazi Jewish ancestry. There is no known consanguinity.     GENETIC COUNSELING ASSESSMENT: [name] is a 40 y.o. female with a family history of breast and ovarian cancer which is somewhat suggestive of a hereditary cancer syndrome and predisposition to cancer. We, therefore, discussed and recommended the following at today's visit.   DISCUSSION: We discussed that approximately 5-10% of breast cancer is hereditary. Most cases of hereditary breast/ovarian cancer are associated with BRCA1/BRCA2 genes, although there are other genes associated with hereditary cancer as well. Cancers and risks are gene specific.  We discussed that testing is beneficial for several reasons including  knowing about other cancer risks, identifying potential screening and risk-reduction options that may be appropriate, and to understand if other family members could be at risk for cancer and allow them to undergo genetic testing.   We reviewed the characteristics, features and inheritance patterns of hereditary cancer syndromes. We also discussed genetic testing, including the appropriate family members to test, the process of testing, insurance coverage and turn-around-time for results. We discussed the implications of a negative, positive and/or variant of uncertain significant result. We recommended Ms. Tredway pursue genetic testing for the [facility] CustomNext (49) gene panel.   Based on Ms. Kuras's family history of cancer, she meets medical criteria for genetic testing. Despite that she meets criteria, she may still have an out of pocket cost. We discussed that if her out of pocket cost for testing  is over $100, the laboratory will call and confirm whether she wants to proceed with testing.  If the out of pocket cost of testing is less than $100 she will be billed by the genetic testing laboratory.   PLAN: After considering the risks, benefits, and limitations, Ms. Tischer provided informed consent to pursue genetic testing and the blood sample was sent to [facility] for analysis of the CustomNext +RNA panel. Results should be available within approximately 2-3 weeks' time, at which point they will be disclosed by telephone to Ms. Ratay, as will any additional recommendations warranted by these results. Ms. Kotowski will receive a summary of her genetic counseling visit and a copy of her results once available. This information will also be available in Epic.   Ms. Lerner questions were answered to her satisfaction today. Our contact information was provided should additional questions or concerns arise. Thank you for the referral and allowing [place] to share in the care of  your patient.   Faith Rogue, MS, [facility] Genetic Counselor [email or web address] Phone: [phone]  The patient was seen for a total of 35 minutes in virtual genetic counseling.  Patient was seen alone. Patient was seen with [facility] intern Raymond Gurney. Dr. Grayland Ormond was available for discussion regarding this case.   _______________________________________________________________________ For Office Staff:  Number of people involved in session: 2 Was an Intern/ student involved with case: yes

## 2021-01-27 ENCOUNTER — Telehealth: Payer: Self-pay | Admitting: Licensed Clinical Social Worker

## 2021-01-28 ENCOUNTER — Ambulatory Visit: Payer: Self-pay | Admitting: Licensed Clinical Social Worker

## 2021-01-28 ENCOUNTER — Encounter: Payer: Self-pay | Admitting: Licensed Clinical Social Worker

## 2021-01-28 DIAGNOSIS — Z801 Family history of malignant neoplasm of trachea, bronchus and lung: Secondary | ICD-10-CM

## 2021-01-28 DIAGNOSIS — Z808 Family history of malignant neoplasm of other organs or systems: Secondary | ICD-10-CM

## 2021-01-28 DIAGNOSIS — Z800 Family history of malignant neoplasm of digestive organs: Secondary | ICD-10-CM

## 2021-01-28 DIAGNOSIS — Z1379 Encounter for other screening for genetic and chromosomal anomalies: Secondary | ICD-10-CM

## 2021-01-28 DIAGNOSIS — Z803 Family history of malignant neoplasm of breast: Secondary | ICD-10-CM

## 2021-01-28 DIAGNOSIS — Z8041 Family history of malignant neoplasm of ovary: Secondary | ICD-10-CM

## 2021-01-28 DIAGNOSIS — Z7189 Other specified counseling: Secondary | ICD-10-CM | POA: Diagnosis not present

## 2021-01-28 DIAGNOSIS — U071 COVID-19: Secondary | ICD-10-CM | POA: Diagnosis not present

## 2021-01-28 NOTE — Progress Notes (Signed)
HPI:  Patricia Franco was previously seen in the [place] clinic due to a family history of cancer and concerns regarding a hereditary predisposition to cancer. Please refer to our prior cancer genetics clinic note for more information regarding our discussion, assessment and recommendations, at the time. Patricia Franco recent genetic test results were disclosed to her, as were recommendations warranted by these results. These results and recommendations are discussed in more detail below.  CANCER HISTORY:  Oncology History   No history exists.    FAMILY HISTORY:  We obtained a detailed, 4-generation family history.  Significant diagnoses are listed below: Family History  Problem Relation Age of Onset   Malignant hyperthermia Mother    Deep vein thrombosis Mother    Obesity Father    Malignant hyperthermia Sister    Thyroid cancer Sister 98       papillary   Deep vein thrombosis Sister    Malignant hyperthermia Sister    Malignant hyperthermia Sister    Malignant hyperthermia Sister    Malignant hyperthermia Sister    Malignant hyperthermia Sister    Malignant hyperthermia Brother    Malignant hyperthermia Brother    Malignant hyperthermia Brother    Breast cancer Maternal Aunt 71   Ovarian cancer Maternal Aunt 71   Malignant hyperthermia Maternal Uncle    Lung cancer Maternal Grandmother    Lung cancer Maternal Grandfather    Throat cancer Maternal Grandfather    Esophageal cancer Maternal Grandfather    Heart attack Paternal Grandmother    Colon cancer Paternal Grandfather    Patricia Franco had 4 brothers and 6 sisters. One brother died at birth. One of her sisters had papillary thyroid cancer at 54 and is living at 69.    Patricia Franco mother is living at 37, no cancers. Patient had 1 maternal uncle, 2 maternal aunts. One aunt had breast and ovarian cancer at 96 and died at 72. No known cancers in maternal cousins. Maternal grandmother had lung cancer  in her mid 47s and was a heavy smoker, she passed from this. Maternal grandfather also was a heavy smoker and had lung, throat and esophageal cancer and died in his mid 60s as well.   Patricia Franco father is living at 28. Patient has 2 paternal uncles, no cancers for either of them or for their children. Paternal grandfather had colon cancer. Grandmother passed at 70.   Patricia Franco is unaware of previous family history of genetic testing for hereditary cancer risks. Patient's maternal ancestors are of [place] and [place] descent, and paternal ancestors are of [place] and [place] descent. There is no reported Ashkenazi Jewish ancestry. There is no known consanguinity.      GENETIC TEST RESULTS: Genetic testing reported out on 01/27/2021 through the [facility] CustomNext+RNA cancer panel found no pathogenic mutations.   The CustomNext-Cancer + RNAinsight panel  includes sequencing and/or deletion duplication testing of the following 47 genes: APC, ATM, AXIN2, BARD1, BMPR1A, BRCA1, BRCA2, BRIP1, CDH1, CDKN2A (p14ARF), CDKN2A (p16INK4a), CKD4, CHEK2, CTNNA1, DICER1, EPCAM (Deletion/duplication testing only), GREM1 (promoter region deletion/duplication testing only), KIT, MEN1, MLH1, MSH2, MSH3, MSH6, MUTYH, NBN, NF1, NHTL1, PALB2, PDGFRA, PMS2, POLD1, POLE, PTEN, RAD50, RAD51C, RAD51D, SDHB, SDHC, SDHD, SMAD4, SMARCA4. STK11, TP53, TSC1, TSC2, and VHL.  The following genes were evaluated for sequence changes only: SDHA and HOXB13 c.251G>A variant only.  The test report has been scanned into EPIC and is located under the Molecular Pathology section of the Results Review tab.  A  portion of the result report is included below for reference.     We discussed that because current genetic testing is not perfect, it is possible there may be a gene mutation in one of these genes that current testing cannot detect, but that chance is small.  There could be another gene that has not yet been discovered, or that  we have not yet tested, that is responsible for the cancer diagnoses in the family. It is also possible there is a hereditary cause for the cancer in the family that Patricia Franco did not inherit and therefore was not identified in her testing.  Therefore, it is important to remain in touch with cancer genetics in the future so that we can continue to offer Patricia Franco the most up to date genetic testing.   Genetic testing did identify a variant of uncertain significance (VUS) in the BRIP1 gene called c.1984G>A.  At this time, it is unknown if this variant is associated with increased cancer risk or if this is a normal finding, but most variants such as this get reclassified to being inconsequential. It should not be used to make medical management decisions. With time, we suspect the lab will determine the significance of this variant, if any. If we do learn more about it we will try to contact Patricia Franco to discuss it further. However, it is important to stay in touch with [place] periodically and keep the address and phone number up to date.  ADDITIONAL GENETIC TESTING: We discussed with Patricia Franco that her genetic testing was fairly extensive.  If there are genes identified to increase cancer risk that can be analyzed in the future, we would be happy to discuss and coordinate this testing at that time.    CANCER SCREENING RECOMMENDATIONS: Patricia Franco test result is considered negative (normal).  This means that we have not identified a hereditary cause for her  family history of cancer at this time.   While reassuring, this does not definitively rule out a hereditary predisposition to cancer. It is still possible that there could be genetic mutations that are undetectable by current technology. There could be genetic mutations in genes that have not been tested or identified to increase cancer risk.  Therefore, it is recommended she continue to follow the cancer management and screening  guidelines provided by her primary healthcare provider.   An individual's cancer risk and medical management are not determined by genetic test results alone. Overall cancer risk assessment incorporates additional factors, including personal medical history, family history, and any available genetic information that may result in a personalized plan for cancer prevention and surveillance.  Based on Patricia Franco's personal and family history of cancer as well as her genetic test results, risk model Harriett Rush was used to estimate her risk of developing breast cancer. This estimates her lifetime risk of developing breast cancer to be approximately 24.5%.  The patient's lifetime breast cancer risk is a preliminary estimate based on available information using one of several models endorsed by the [place] (ACS). The ACS recommends consideration of breast MRI screening as an adjunct to mammography for patients at high risk (defined as 20% or greater lifetime risk).  This risk estimate can change over time and may be repeated to reflect new information in her personal or family history in the future.    Patricia Franco has been determined to be at high risk for breast cancer.  Therefore, we recommend that annual screening with mammography  and breast MRI   We discussed that Patricia Franco should discuss her individual situation with her referring physician and determine a breast cancer screening plan with which they are both comfortable.  She would like to be followed by Dr. Brantley Stage for this if possible.   RECOMMENDATIONS FOR FAMILY MEMBERS:  Relatives in this family might be at some increased risk of developing cancer, over the general population risk, simply due to the family history of cancer.  We recommended female relatives in this family have a yearly mammogram beginning at age 66, or 23 years younger than the earliest onset of cancer, an annual clinical breast exam, and perform monthly  breast self-exams. Female relatives in this family should also have a gynecological exam as recommended by their primary provider.  All family members should be referred for colonoscopy starting at age 38.    It is also possible there is a hereditary cause for the cancer in Patricia Franco's family that she did not inherit and therefore was not identified in her.  Based on Patricia Franco's family history, we recommended maternal relatives (including siblings) have genetic counseling and testing. Patricia Franco will let us know if we can be of any assistance in coordinating genetic counseling and/or testing for these family members.  FOLLOW-UP: Lastly, we discussed with Patricia Franco that cancer genetics is a rapidly advancing field and it is possible that new genetic tests will be appropriate for her and/or her family members in the future. We encouraged her to remain in contact with cancer genetics on an annual basis so we can update her personal and family histories and let her know of advances in cancer genetics that may benefit this family.   Our contact number was provided. Patricia Franco questions were answered to her satisfaction, and she knows she is welcome to call us at anytime with additional questions or concerns.   Faith Rogue, MS, [facility] Genetic Counselor [email or web address] Phone: [phone]

## 2021-01-28 NOTE — Telephone Encounter (Signed)
Revealed negative genetic testing.  Revealed that a VUS in BRIP1 was identified. This normal result is reassuring. It is unlikely that there is an increased risk of cancer due to a mutation in one of these genes.  However, genetic testing is not perfect, and cannot definitively rule out a hereditary cause.  It will be important for her to keep in contact with genetics to learn if any additional testing may be needed in the future.

## 2021-01-30 ENCOUNTER — Emergency Department (HOSPITAL_BASED_OUTPATIENT_CLINIC_OR_DEPARTMENT_OTHER): Payer: BC Managed Care – PPO

## 2021-01-30 ENCOUNTER — Emergency Department (HOSPITAL_BASED_OUTPATIENT_CLINIC_OR_DEPARTMENT_OTHER)
Admission: EM | Admit: 2021-01-30 | Discharge: 2021-01-30 | Disposition: A | Discharge status: Home / Self Care | Payer: BC Managed Care – PPO | Attending: Emergency Medicine | Admitting: Emergency Medicine

## 2021-01-30 ENCOUNTER — Encounter (HOSPITAL_BASED_OUTPATIENT_CLINIC_OR_DEPARTMENT_OTHER): Payer: Self-pay | Admitting: Emergency Medicine

## 2021-01-30 ENCOUNTER — Other Ambulatory Visit: Payer: Self-pay

## 2021-01-30 DIAGNOSIS — M47812 Spondylosis without myelopathy or radiculopathy, cervical region: Secondary | ICD-10-CM | POA: Diagnosis not present

## 2021-01-30 DIAGNOSIS — U071 COVID-19: Secondary | ICD-10-CM | POA: Insufficient documentation

## 2021-01-30 DIAGNOSIS — R0600 Dyspnea, unspecified: Secondary | ICD-10-CM

## 2021-01-30 DIAGNOSIS — R0789 Other chest pain: Secondary | ICD-10-CM

## 2021-01-30 DIAGNOSIS — R0602 Shortness of breath: Secondary | ICD-10-CM | POA: Diagnosis not present

## 2021-01-30 DIAGNOSIS — R059 Cough, unspecified: Secondary | ICD-10-CM | POA: Diagnosis not present

## 2021-01-30 DIAGNOSIS — K115 Sialolithiasis: Secondary | ICD-10-CM | POA: Diagnosis not present

## 2021-01-30 DIAGNOSIS — J028 Acute pharyngitis due to other specified organisms: Secondary | ICD-10-CM

## 2021-01-30 LAB — CBC WITH DIFFERENTIAL/PLATELET
Abs Immature Granulocytes: 0.03 10*3/uL (ref 0.00–0.07)
Basophils Absolute: 0 10*3/uL (ref 0.0–0.1)
Basophils Relative: 1 %
Eosinophils Absolute: 0.2 10*3/uL (ref 0.0–0.5)
Eosinophils Relative: 4 %
HCT: 40.5 % (ref 36.0–46.0)
Hemoglobin: 13.8 g/dL (ref 12.0–15.0)
Immature Granulocytes: 1 %
Lymphocytes Relative: 30 %
Lymphs Abs: 1.8 10*3/uL (ref 0.7–4.0)
MCH: 30.7 pg (ref 26.0–34.0)
MCHC: 34.1 g/dL (ref 30.0–36.0)
MCV: 90 fL (ref 80.0–100.0)
Monocytes Absolute: 0.5 10*3/uL (ref 0.1–1.0)
Monocytes Relative: 9 %
Neutro Abs: 3.4 10*3/uL (ref 1.7–7.7)
Neutrophils Relative %: 55 %
Platelets: 279 10*3/uL (ref 150–400)
RBC: 4.5 MIL/uL (ref 3.87–5.11)
RDW: 12.7 % (ref 11.5–15.5)
WBC: 6.1 10*3/uL (ref 4.0–10.5)
nRBC: 0 % (ref 0.0–0.2)

## 2021-01-30 LAB — COMPREHENSIVE METABOLIC PANEL
ALT: 32 U/L (ref 0–44)
AST: 23 U/L (ref 15–41)
Albumin: 4.1 g/dL (ref 3.5–5.0)
Alkaline Phosphatase: 81 U/L (ref 38–126)
Anion gap: 6 (ref 5–15)
BUN: 14 mg/dL (ref 6–20)
CO2: 24 mmol/L (ref 22–32)
Calcium: 9.6 mg/dL (ref 8.9–10.3)
Chloride: 107 mmol/L (ref 98–111)
Creatinine, Ser: 0.73 mg/dL (ref 0.44–1.00)
GFR, Estimated: >60 mL/min (ref >60)
Glucose, Bld: 93 mg/dL (ref 70–99)
Potassium: 4.1 mmol/L (ref 3.5–5.1)
Sodium: 137 mmol/L (ref 135–145)
Total Bilirubin: 0.4 mg/dL (ref 0.3–1.2)
Total Protein: 7.7 g/dL (ref 6.5–8.1)

## 2021-01-30 LAB — TROPONIN I (HIGH SENSITIVITY)
Troponin I (High Sensitivity): <2 ng/L (ref <18)
Troponin I (High Sensitivity): 2 ng/L (ref <18)

## 2021-01-30 LAB — PREGNANCY, URINE: Preg Test, Ur: NEGATIVE

## 2021-01-30 MED ORDER — RACEPINEPHRINE HCL 2.25 % IN NEBU
0.5000 mL | INHALATION_SOLUTION | Freq: Once | RESPIRATORY_TRACT | Status: AC
  Administered 2021-01-30: 0.5 mL via RESPIRATORY_TRACT
  Filled 2021-01-30: qty 0.5

## 2021-01-30 MED ORDER — IOHEXOL 350 MG/ML SOLN
100.0000 mL | Freq: Once | INTRAVENOUS | Status: AC | PRN
  Administered 2021-01-30: 100 mL via INTRAVENOUS

## 2021-01-30 MED ORDER — DEXAMETHASONE SODIUM PHOSPHATE 10 MG/ML IJ SOLN
16.0000 mg | Freq: Once | INTRAMUSCULAR | Status: AC
  Administered 2021-01-30: 16 mg via INTRAVENOUS
  Filled 2021-01-30: qty 2

## 2021-01-30 NOTE — ED Notes (Signed)
Assumed care from [facility]. Patient laying quietly on gurney. This RN introduced self. No acute distress noted. Patient updated on plan of care. Will continue to monitor.

## 2021-01-30 NOTE — ED Notes (Signed)
RT assessed patient in triage. When I arrived she was coughing and appears SOB. Audible "wheezing" noted, but upon auscultation BBS clear. Noises are coming from throat. Patient endorses sore throat and "violent coughing." SAT 100% on RA

## 2021-01-30 NOTE — ED Triage Notes (Addendum)
Pt presents to ED POV. Pt c/o SOB since this morning ~0730. SOB w/ cough in triage. Pt covid+ 10/11. Pt is on course of paxlovid

## 2021-01-30 NOTE — ED Provider Notes (Signed)
[facility] EMERGENCY DEPARTMENT Provider Note   CSN: [id number] Arrival date & time: 01/30/21  1023     History Chief Complaint  Patient presents with   Shortness of Breath   COVID+    Patricia Franco is a 40 y.o. female.  HPI     40yo female with history of migraines, presents with concern for shortness of breath in the setting of positive COVID-19 test October 11.  Reports that symptoms started a few days ago with fever, cough, congestion and sore throat.  She has not had diarrhea, she has fatigue.  She has been taking Paxilovid.  She has had her COVID-vaccine booster. This AM, developed sudden onset of shortness of breath.  Feels a tightness in her chest and in her throat. Feels like shortness of breath in back of throat  Past Medical History:  Diagnosis Date   Family history of breast cancer    Family history of colon cancer    Family history of lung cancer    Family history of ovarian cancer    Family history of thyroid cancer    Heart murmur    Malignant hyperthermia    pt's mother   Malignant hyperthermia    Migraine headache with aura 10/06/2013   Migraines    Miscarriage 2012, 2013   Postpartum care following vaginal delivery (9/21) 01/07/2014    Patient Active Problem List   Diagnosis Date Noted   Genetic testing 01/28/2021   Family history of breast cancer 12/24/2020   Family history of ovarian cancer 12/24/2020   Family history of thyroid cancer 12/24/2020   Family history of colon cancer 12/24/2020   Family history of lung cancer 12/24/2020   SVD (spontaneous vaginal delivery) 01/07/2014   Postpartum care following vaginal delivery (9/21) 01/07/2014   Pregnancy 01/06/2014   Migraine headache with aura 10/06/2013    Past Surgical History:  Procedure Laterality Date   MOUTH SURGERY       OB History     Gravida  3   Para  1   Term  1   Preterm      AB  2   Living  1      SAB  2   IAB      Ectopic      Multiple       Live Births  1           Family History  Problem Relation Age of Onset   Malignant hyperthermia Mother    Deep vein thrombosis Mother    Obesity Father    Malignant hyperthermia Sister    Thyroid cancer Sister 58       papillary   Deep vein thrombosis Sister    Malignant hyperthermia Sister    Malignant hyperthermia Sister    Malignant hyperthermia Sister    Malignant hyperthermia Sister    Malignant hyperthermia Sister    Malignant hyperthermia Brother    Malignant hyperthermia Brother    Malignant hyperthermia Brother    Breast cancer Maternal Aunt 46   Ovarian cancer Maternal Aunt 71   Malignant hyperthermia Maternal Uncle    Lung cancer Maternal Grandmother    Lung cancer Maternal Grandfather    Throat cancer Maternal Grandfather    Esophageal cancer Maternal Grandfather    Heart attack Paternal Grandmother    Colon cancer Paternal Grandfather     Social History   Tobacco Use   Smoking status: Never  Substance Use Topics   Alcohol  use: No   Drug use: No    Home Medications Prior to Admission medications   Medication Sig Start Date End Date Taking? Authorizing Provider  EPINEPHrine (EPIPEN IJ) Inject 1 each as directed as needed (for anaphylaxis).    [provider]  ibuprofen (ADVIL,MOTRIN) 600 MG tablet Take 1 tablet (600 mg total) by mouth every 6 (six) hours. 01/09/14   Donette Larry, CNM  oxyCODONE-acetaminophen (PERCOCET/ROXICET) 5-325 MG per tablet Take 1-2 tablets by mouth every 4 (four) hours as needed (for pain scale equal to or greater than 7). 01/09/14   Donette Larry, CNM  predniSONE (DELTASONE) 20 MG tablet 2 tabs po daily x 4 days 06/02/18   Melene Plan, DO  Prenatal Vit-Fe Fumarate-FA (PRENATAL MULTIVITAMIN) TABS tablet Take 1 tablet by mouth daily.    [provider]    Allergies    Bee venom, Penicillins, and Penicillins  Review of Systems   Review of Systems  Constitutional:  Positive for appetite change,  fatigue and fever.  HENT:  Positive for sore throat and trouble swallowing.   Eyes:  Negative for visual disturbance.  Respiratory:  Positive for cough, shortness of breath, wheezing and stridor.   Cardiovascular:  Negative for chest pain.  Gastrointestinal:  Negative for abdominal pain, nausea and vomiting.  Genitourinary:  Negative for difficulty urinating.  Musculoskeletal:  Negative for back pain and neck pain.  Skin:  Negative for rash.  Neurological:  Negative for syncope and headaches.   Physical Exam Updated Vital Signs BP 129/73   Pulse 96   Temp 98.2 F (36.8 C) (Oral)   Resp 16   Ht 5\' 8"  (1.727 m)   Wt 99.8 kg   SpO2 98%   BMI 33.45 kg/m   Physical Exam Vitals and nursing note reviewed.  Constitutional:      General: She is not in acute distress.    Appearance: She is well-developed. She is ill-appearing. She is not diaphoretic.  HENT:     Head: Normocephalic and atraumatic.  Eyes:     Conjunctiva/sclera: Conjunctivae normal.  Cardiovascular:     Rate and Rhythm: Normal rate and regular rhythm.     Heart sounds: Normal heart sounds. No murmur heard.   No friction rub. No gallop.  Pulmonary:     Effort: Pulmonary effort is normal. No respiratory distress.     Breath sounds: Normal breath sounds. Stridor present. No wheezing or rales.  Abdominal:     General: There is no distension.     Palpations: Abdomen is soft.     Tenderness: There is no abdominal tenderness. There is no guarding.  Musculoskeletal:        General: No tenderness.     Cervical back: Normal range of motion.  Skin:    General: Skin is warm and dry.     Findings: No erythema or rash.  Neurological:     Mental Status: She is alert and oriented to person, place, and time.    ED Results / Procedures / Treatments   Labs (all labs ordered are listed, but only abnormal results are displayed) Labs Reviewed  CBC WITH DIFFERENTIAL/PLATELET  COMPREHENSIVE METABOLIC PANEL  PREGNANCY, URINE   TROPONIN I (HIGH SENSITIVITY)  TROPONIN I (HIGH SENSITIVITY)    EKG EKG Interpretation  Date/Time:  Friday January 30 2021 10:33:39 EDT Ventricular Rate:  98 PR Interval:  124 QRS Duration: 74 QT Interval:  330 QTC Calculation: 421 R Axis:   60 Text Interpretation: Normal sinus rhythm  Normal ECG No previous ECGs available Confirmed by Alvira Monday ([id number]) on 01/30/2021 12:13:30 PM  Radiology CT Soft Tissue Neck W Contrast  Result Date: 01/30/2021 CLINICAL DATA:  Epiglottitis or tonsillitis suspected; shortness of breath with cough EXAM: CT NECK WITH CONTRAST TECHNIQUE: Multidetector CT imaging of the neck was performed using the standard protocol following the bolus administration of intravenous contrast. CONTRAST:  OMNIPAQUE IOHEXOL 350 MG/ML SOLN COMPARISON:  None. FINDINGS: Pharynx and larynx: Mild prominence of the adenoids, palatine tonsils, and lingual tonsils. No evidence of peritonsillar abscess. Parapharyngeal fat is preserved. Airway is patent. Salivary glands: Few punctate stones within the left parotid. Right parotid and both submandibular glands are unremarkable. Thyroid: Unremarkable. Lymph nodes: No enlarged or abnormal density nodes. Vascular: Major neck vessels are patent. Limited intracranial: No abnormal enhancement. Visualized orbits: Unremarkable. Mastoids and visualized paranasal sinuses: Paranasal sinus mucosal thickening. Mastoid air cells are clear. Skeleton: Minor cervical spine degenerative changes. Upper chest: Dictated separately. Other: None. IMPRESSION: No significant inflammatory changes. No peritonsillar abscess. Airway is patent. Few punctate left parotid stones. Electronically Signed   By: Guadlupe Spanish M.D.   On: 01/30/2021 13:26   CT Angio Chest PE W and/or Wo Contrast  Result Date: 01/30/2021 CLINICAL DATA:  Shortness of breath since 0730 hours this morning, cough, diagnosed COVID-19 positive on 01/27/2021, on course of PaxilOvid EXAM: CT  ANGIOGRAPHY CHEST WITH CONTRAST TECHNIQUE: Multidetector CT imaging of the chest was performed using the standard protocol during bolus administration of intravenous contrast. Multiplanar CT image reconstructions and MIPs were obtained to evaluate the vascular anatomy. CONTRAST:  OMNIPAQUE IOHEXOL 350 MG/ML SOLN IV COMPARISON:  None FINDINGS: Cardiovascular: Aorta normal caliber without aneurysm or dissection. Heart unremarkable. No pericardial effusion. Pulmonary arteries adequately opacified and patent. No evidence of pulmonary embolism. Mediastinum/Nodes: Base of cervical region normal appearance. Esophagus unremarkable. No thoracic adenopathy. Lungs/Pleura: Scattered calcified pulmonary granulomata. Lungs otherwise clear. No pulmonary infiltrate, pleural effusion, or pneumothorax. Upper Abdomen: Unremarkable Musculoskeletal: Unremarkable Review of the MIP images confirms the above findings. IMPRESSION: No evidence of pulmonary embolism. Old granulomatous disease. No acute intrathoracic abnormalities. Electronically Signed   By: Ulyses Southward M.D.   On: 01/30/2021 13:49   DG Chest Portable 1 View  Result Date: 01/30/2021 CLINICAL DATA:  COVID positive EXAM: PORTABLE CHEST 1 VIEW COMPARISON:  None. FINDINGS: The heart size and mediastinal contours are within normal limits. Both lungs are clear. The visualized skeletal structures are unremarkable. IMPRESSION: Lungs are clear. Electronically Signed   By: Allegra Lai M.D.   On: 01/30/2021 11:34    Procedures Procedures   Medications Ordered in ED Medications  dexamethasone (DECADRON) injection 16 mg (16 mg Intravenous Given 01/30/21 1152)  Racepinephrine HCl 2.25 % nebulizer solution 0.5 mL (0.5 mLs Nebulization Given 01/30/21 1203)  iohexol (OMNIPAQUE) 350 MG/ML injection 100 mL (100 mLs Intravenous Contrast Given 01/30/21 1255)    ED Course  I have reviewed the triage vital signs and the nursing notes.  Pertinent labs & imaging results  that were available during my care of the patient were reviewed by me and considered in my medical decision making (see chart for details).    MDM Rules/Calculators/A&P                            40yo female with history of migraines, presents with concern for shortness of breath in the setting of positive COVID-19 test October 11.  Presents with acute  shortness of breath, chest tightness and stridor, ddx includes epiglottitis, severe laryngitis, RPA, PE, ACS, myocarditis, vocal cord paralysis.  No other symptoms to suggest anaphylaxis or timing of medications. Given decadron, racemic epinephrine for symptoms.  CT soft tissue neck shows no sign of epiglottitis or airway narrowing. CT PE study performed given COVID, dyspnea, no abnormalities on CXR shows no evidence of PE or occult pneumonia.  Oxygen levels normal. Troponin negative, doubt ACS or myocarditis. Feels improved after treatment.  Suspect symptoms related to COVID and stress.  Recommend continued supportive care.      Final Clinical Impression(s) / ED Diagnoses Final diagnoses:  COVID-19  Dyspnea, unspecified type  Pharyngitis due to other organism  Chest tightness    Rx / DC Orders ED Discharge Orders     None        Alvira Monday, MD 01/31/21 [phone]

## 2021-01-30 NOTE — ED Notes (Signed)
Coke given

## 2021-02-02 DIAGNOSIS — N6452 Nipple discharge: Secondary | ICD-10-CM | POA: Diagnosis not present

## 2021-03-17 ENCOUNTER — Other Ambulatory Visit: Payer: Self-pay | Admitting: Surgery

## 2021-03-17 DIAGNOSIS — N6042 Mammary duct ectasia of left breast: Secondary | ICD-10-CM | POA: Diagnosis not present

## 2021-03-17 DIAGNOSIS — N6452 Nipple discharge: Secondary | ICD-10-CM | POA: Diagnosis not present

## 2021-05-07 DIAGNOSIS — Z79810 Long term (current) use of selective estrogen receptor modulators (SERMs): Secondary | ICD-10-CM | POA: Diagnosis not present

## 2021-05-07 DIAGNOSIS — Z3040 Encounter for surveillance of contraceptives, unspecified: Secondary | ICD-10-CM | POA: Diagnosis not present

## 2021-05-07 DIAGNOSIS — Z124 Encounter for screening for malignant neoplasm of cervix: Secondary | ICD-10-CM | POA: Diagnosis not present

## 2021-05-07 DIAGNOSIS — Z01419 Encounter for gynecological examination (general) (routine) without abnormal findings: Secondary | ICD-10-CM | POA: Diagnosis not present

## 2021-05-28 DIAGNOSIS — Z9189 Other specified personal risk factors, not elsewhere classified: Secondary | ICD-10-CM | POA: Diagnosis not present

## 2021-05-28 DIAGNOSIS — Z23 Encounter for immunization: Secondary | ICD-10-CM | POA: Diagnosis not present

## 2021-05-28 DIAGNOSIS — Z1159 Encounter for screening for other viral diseases: Secondary | ICD-10-CM | POA: Diagnosis not present

## 2021-05-28 DIAGNOSIS — Z79899 Other long term (current) drug therapy: Secondary | ICD-10-CM | POA: Diagnosis not present

## 2021-05-28 DIAGNOSIS — Z13220 Encounter for screening for lipoid disorders: Secondary | ICD-10-CM | POA: Diagnosis not present

## 2021-05-28 DIAGNOSIS — Z0001 Encounter for general adult medical examination with abnormal findings: Secondary | ICD-10-CM | POA: Diagnosis not present

## 2021-05-28 DIAGNOSIS — Z0000 Encounter for general adult medical examination without abnormal findings: Secondary | ICD-10-CM | POA: Diagnosis not present

## 2021-05-28 DIAGNOSIS — F419 Anxiety disorder, unspecified: Secondary | ICD-10-CM | POA: Diagnosis not present

## 2021-07-06 DIAGNOSIS — Z1239 Encounter for other screening for malignant neoplasm of breast: Secondary | ICD-10-CM | POA: Diagnosis not present

## 2021-07-14 DIAGNOSIS — F419 Anxiety disorder, unspecified: Secondary | ICD-10-CM | POA: Diagnosis not present

## 2021-07-14 DIAGNOSIS — Z975 Presence of (intrauterine) contraceptive device: Secondary | ICD-10-CM | POA: Diagnosis not present

## 2021-07-16 DIAGNOSIS — F419 Anxiety disorder, unspecified: Secondary | ICD-10-CM | POA: Diagnosis not present

## 2021-07-16 DIAGNOSIS — F320 Major depressive disorder, single episode, mild: Secondary | ICD-10-CM | POA: Diagnosis not present

## 2021-08-20 IMAGING — MR MR BREAST BX W/ LOC DEV 1ST LEASION IMAGE BX SPEC MR GUIDE*R*
7 of 12 series · 27 of 48 positions shown · IV contrast (gadavist)
Comparison: Previous exams.
COMPARISON: Previous exams.
COMPARISON: Previous exams.

Addendum:
CLINICAL DATA: Patient presents for MRI guided biopsy of 2 sites
right breast upper outer right periareolar region and 1 site left
breast outer lower quadrant. Recent history of left bloody nipple
discharge with negative mammographic/sonographic workup.

EXAM:
MRI GUIDED CORE NEEDLE BIOPSY OF THE BILATERAL BREAST
TECHNIQUE: Multiplanar, multisequence MR imaging of the bilateral breast was
performed both before and after administration of intravenous
contrast.
CONTRAST:  8 mL Gadavist

[Series 3: fiducial bilateral · sagittal · 2.0mm · 1.33mm/px · 4 of 160 slices shown]
[im 1/160]
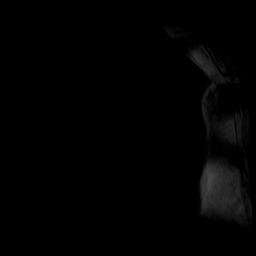
[im 54/160]
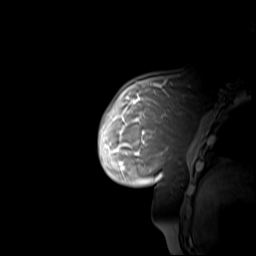
[im 107/160]
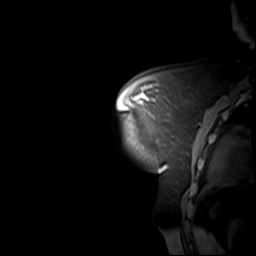
[im 160/160]
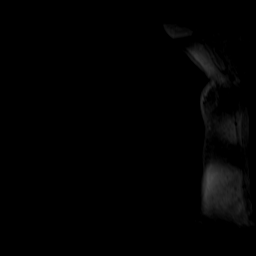

[Series 4: dynamic pre · axial · non-contrast · 1.3mm · 0.73mm/px · z∈[-67,+139]mm · 4 of 160 slices shown]
[im 1/160]
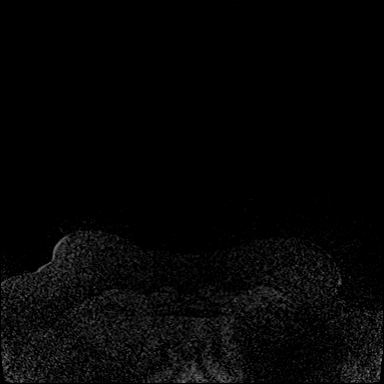
[im 54/160]
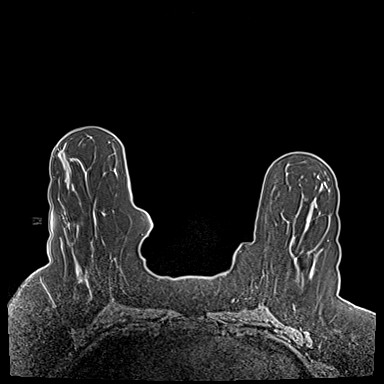
[im 107/160]
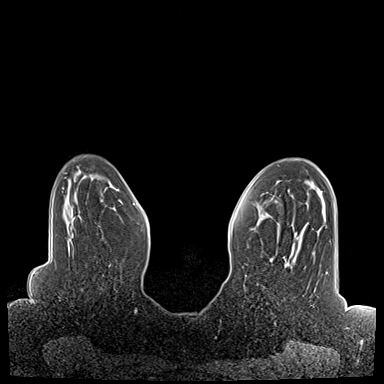
[im 160/160]
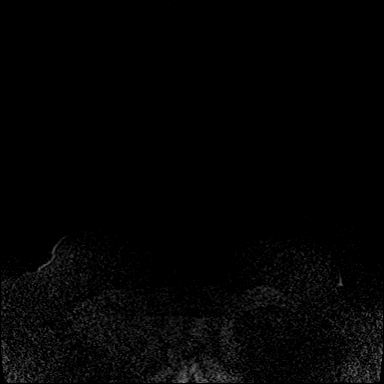

[Series 5: dynamic post 20 · axial · 1.3mm · 0.73mm/px · z∈[-67,+139]mm · 4 of 160 slices shown (1 of 2)]
[im 1/160]
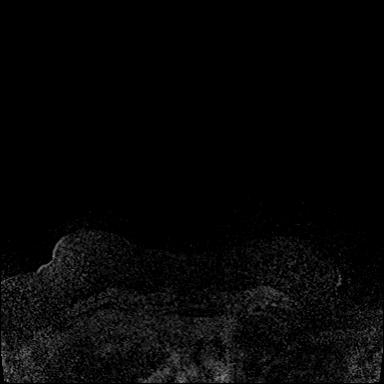
[im 54/160]
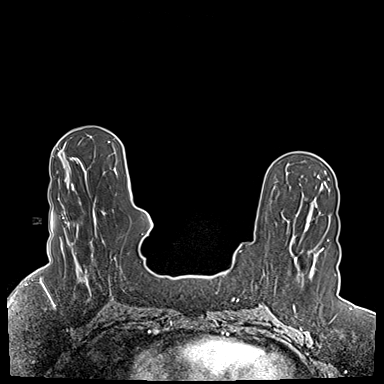
[im 107/160]
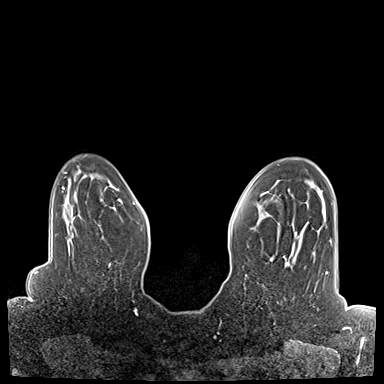
[im 160/160]
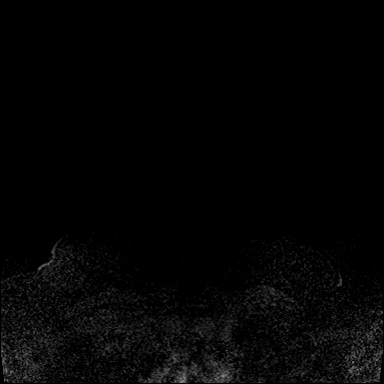

[Series 6: dynamic post 20 · axial · 1.3mm · 0.73mm/px · z∈[-67,+139]mm · 4 of 160 slices shown (2 of 2)]
[im 1/160]
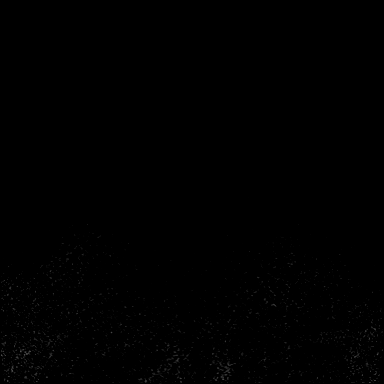
[im 54/160]
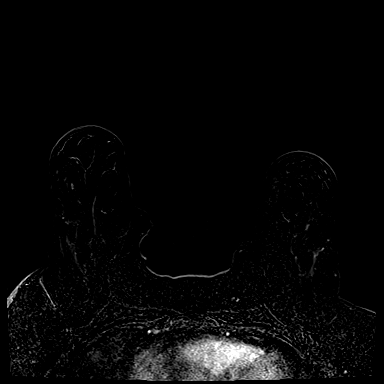
[im 107/160]
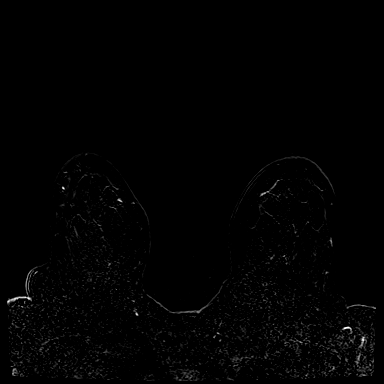
[im 160/160]
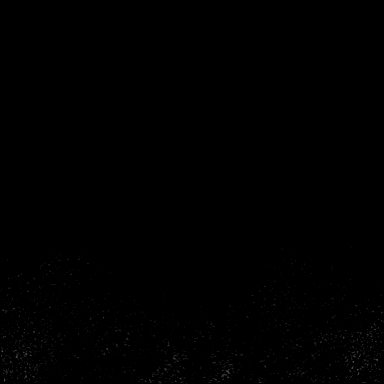

[Series 7: dynamic post 3 · axial · 1.3mm · 0.73mm/px · z∈[-67,+139]mm · 4 of 160 slices shown (1 of 2)]
[im 1/160]
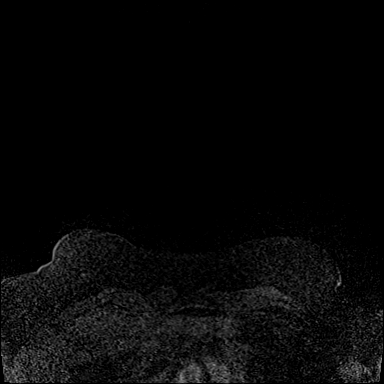
[im 54/160]
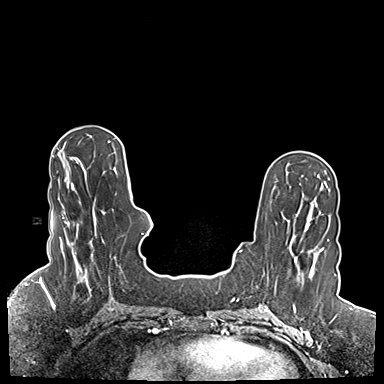
[im 107/160]
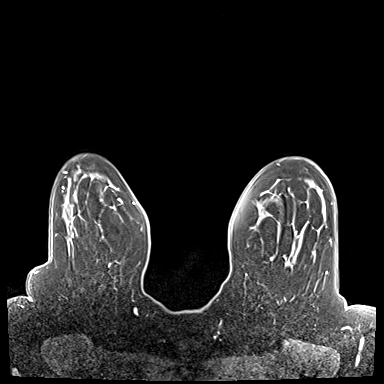
[im 160/160]
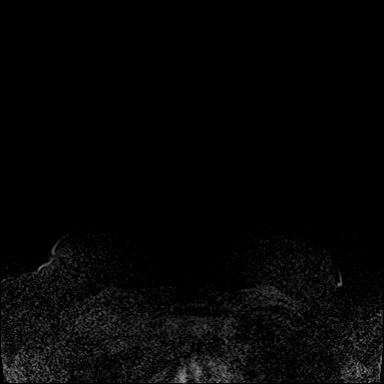

[Series 8: dynamic post 3 · axial · 1.3mm · 0.73mm/px · z∈[-67,+139]mm · 4 of 160 slices shown (2 of 2)]
[im 1/160]
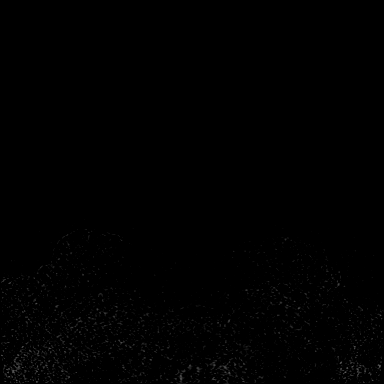
[im 54/160]
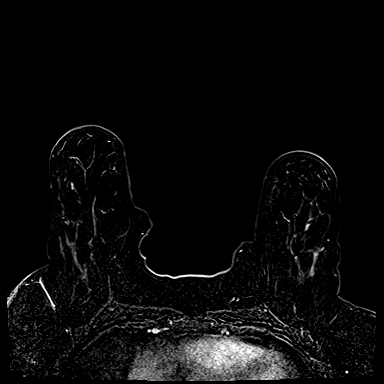
[im 107/160]
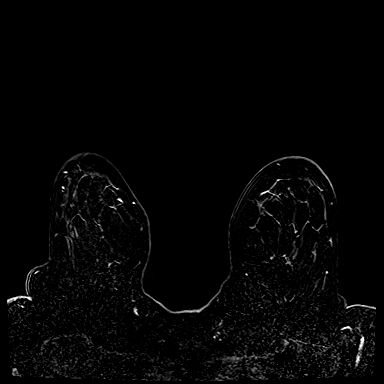
[im 160/160]
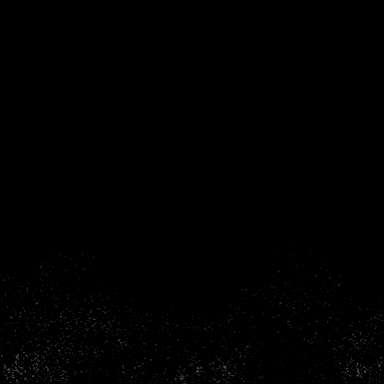

[Series 9: dynamic post 5 · axial · 1.3mm · 0.73mm/px · z∈[-67,+71]mm · 3 of 160 slices shown]
[im 1/160]
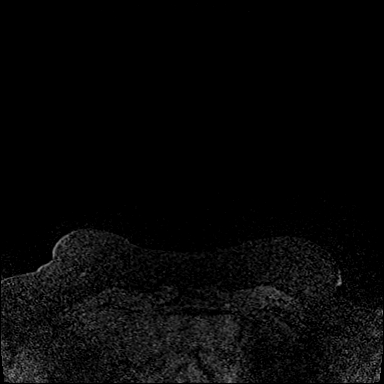
[im 54/160]
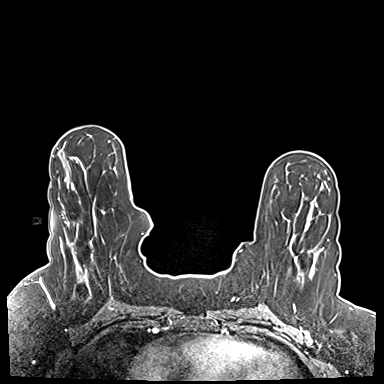
[im 107/160]
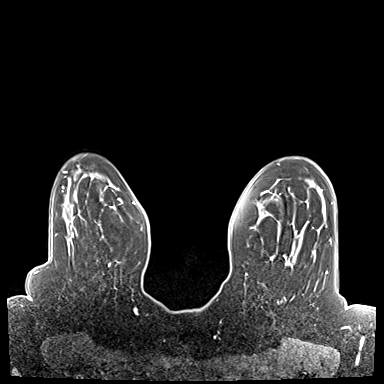

[27 of 48 positions shown; findings below may reference images not displayed]

FINDINGS: I met with the patient, and we discussed the procedure of MRI guided
biopsy, including risks, benefits, and alternatives. Specifically,
we discussed the risks of infection, bleeding, tissue injury, clip
migration, and inadequate sampling. Informed, written consent was
given. The usual time out protocol was performed immediately prior
to the procedure.

1. Using sterile technique, 1% Lidocaine, MRI guidance, and a 9
gauge vacuum assisted device, biopsy was performed of targeted 4 mm
indeterminate left breast mass over the outer lower quadrant using a
lateral to medial approach. At the conclusion of the procedure, a
cylinder shaped tissue marker clip was deployed into the biopsy
cavity. Follow-up 2-view mammogram was performed and dictated
separately.

2. Using sterile technique, 1% Lidocaine, MRI guidance, and a 9
gauge vacuum assisted device, biopsy was performed of targeted
cm non mass enhancement over the right upper outer quadrant (middle
third) using a lateral to medial approach. At the conclusion of the
procedure, a cylinder shaped tissue marker clip was deployed into
the biopsy cavity. Follow-up 2-view mammogram was performed and
dictated separately.

3. Using sterile technique, 1% Lidocaine, MRI guidance, and a 9
gauge vacuum assisted device, biopsy was performed of the targeted 8
mm non mass enhancement over the right upper outer quadrant
(anterior third) using a lateral to medial approach. At the
conclusion of the procedure, a barbell shaped tissue marker clip was
deployed into the biopsy cavity. Follow-up 2-view mammogram was
performed and dictated separately.
IMPRESSION: MRI guided biopsy of 2 sites right breast and single site left
breast as described. No apparent complications.

ADDENDUM:
Note that in the above report, the clip shape in the left breast is
actually a barbell shaped clip. The clip shape over the right breast
upper outer quadrant middle third is also a barbell clip in the clip
shape over the right breast upper outer quadrant anterior third is a
cylinder clip.

ADDENDUM:
Pathology revealed PSUEDOANGIOMATOUS STROMAL HYPERPLASIA (PASH) of
the LEFT breast, outer lower quadrant. This was found to be
concordant by Dr. Rtoyota Joshjax.

Pathology revealed FIBROCYSTIC CHANGE AND STROMAL FIBROSIS of the
RIGHT breast, upper outer quadrant, posterior. This was found to be
concordant by Dr. Rtoyota Joshjax.

Pathology revealed FIBROCYSTIC CHANGE AND STROMAL FIBROSIS of the
RIGHT breast, upper outer quadrant, anterior. This was found to be
concordant by Dr. Rtoyota Joshjax.

Pathology results were discussed with the patient by telephone. The
patient reported doing well after the biopsies with tenderness at
the sites, and bleeding on the RIGHT. Post biopsy instructions and
care were reviewed and questions were answered. The patient was
encouraged to call The [REDACTED] for any
additional concerns. My direct phone number was provided.

Surgical consultation, for further evaluation of LEFT bloody nipple
discharge, has been arranged with Dr. Klinik Hewan Bkone at [REDACTED] on May 12, 2020.

The patient was instructed to return for a bilateral breast MRI in 6
months, per protocol.

Pathology results reported by Rinelle Taplin, RN on 05/09/2020.

*** End of Addendum ***
Addendum:
FINDINGS: I met with the patient, and we discussed the procedure of MRI guided
biopsy, including risks, benefits, and alternatives. Specifically,
we discussed the risks of infection, bleeding, tissue injury, clip
migration, and inadequate sampling. Informed, written consent was
given. The usual time out protocol was performed immediately prior
to the procedure.

1. Using sterile technique, 1% Lidocaine, MRI guidance, and a 9
gauge vacuum assisted device, biopsy was performed of targeted 4 mm
indeterminate left breast mass over the outer lower quadrant using a
lateral to medial approach. At the conclusion of the procedure, a
cylinder shaped tissue marker clip was deployed into the biopsy
cavity. Follow-up 2-view mammogram was performed and dictated
separately.

2. Using sterile technique, 1% Lidocaine, MRI guidance, and a 9
gauge vacuum assisted device, biopsy was performed of targeted
cm non mass enhancement over the right upper outer quadrant (middle
third) using a lateral to medial approach. At the conclusion of the
procedure, a cylinder shaped tissue marker clip was deployed into
the biopsy cavity. Follow-up 2-view mammogram was performed and
dictated separately.

3. Using sterile technique, 1% Lidocaine, MRI guidance, and a 9
gauge vacuum assisted device, biopsy was performed of the targeted 8
mm non mass enhancement over the right upper outer quadrant
(anterior third) using a lateral to medial approach. At the
conclusion of the procedure, a barbell shaped tissue marker clip was
deployed into the biopsy cavity. Follow-up 2-view mammogram was
performed and dictated separately.
IMPRESSION: MRI guided biopsy of 2 sites right breast and single site left
breast as described. No apparent complications.

ADDENDUM:
Note that in the above report, the clip shape in the left breast is
actually a barbell shaped clip. The clip shape over the right breast
upper outer quadrant middle third is also a barbell clip in the clip
shape over the right breast upper outer quadrant anterior third is a
cylinder clip.

*** End of Addendum ***
FINDINGS: I met with the patient, and we discussed the procedure of MRI guided
biopsy, including risks, benefits, and alternatives. Specifically,
we discussed the risks of infection, bleeding, tissue injury, clip
migration, and inadequate sampling. Informed, written consent was
given. The usual time out protocol was performed immediately prior
to the procedure.

1. Using sterile technique, 1% Lidocaine, MRI guidance, and a 9
gauge vacuum assisted device, biopsy was performed of targeted 4 mm
indeterminate left breast mass over the outer lower quadrant using a
lateral to medial approach. At the conclusion of the procedure, a
cylinder shaped tissue marker clip was deployed into the biopsy
cavity. Follow-up 2-view mammogram was performed and dictated
separately.

2. Using sterile technique, 1% Lidocaine, MRI guidance, and a 9
gauge vacuum assisted device, biopsy was performed of targeted
cm non mass enhancement over the right upper outer quadrant (middle
third) using a lateral to medial approach. At the conclusion of the
procedure, a cylinder shaped tissue marker clip was deployed into
the biopsy cavity. Follow-up 2-view mammogram was performed and
dictated separately.

3. Using sterile technique, 1% Lidocaine, MRI guidance, and a 9
gauge vacuum assisted device, biopsy was performed of the targeted 8
mm non mass enhancement over the right upper outer quadrant
(anterior third) using a lateral to medial approach. At the
conclusion of the procedure, a barbell shaped tissue marker clip was
deployed into the biopsy cavity. Follow-up 2-view mammogram was
performed and dictated separately.
IMPRESSION: MRI guided biopsy of 2 sites right breast and single site left
breast as described. No apparent complications.

## 2021-08-20 IMAGING — MG MM BREAST LOCALIZATION CLIP
4 series · 4 of 12 positions shown · non-contrast
Comparison: Previous exam(s).

CLINICAL DATA: Patient is post MRI guided biopsy left breast single
site and right breast 2 sites.

EXAM:
DIAGNOSTIC BILATERAL MAMMOGRAM POST MRI BIOPSY

[R ML synth-2D]
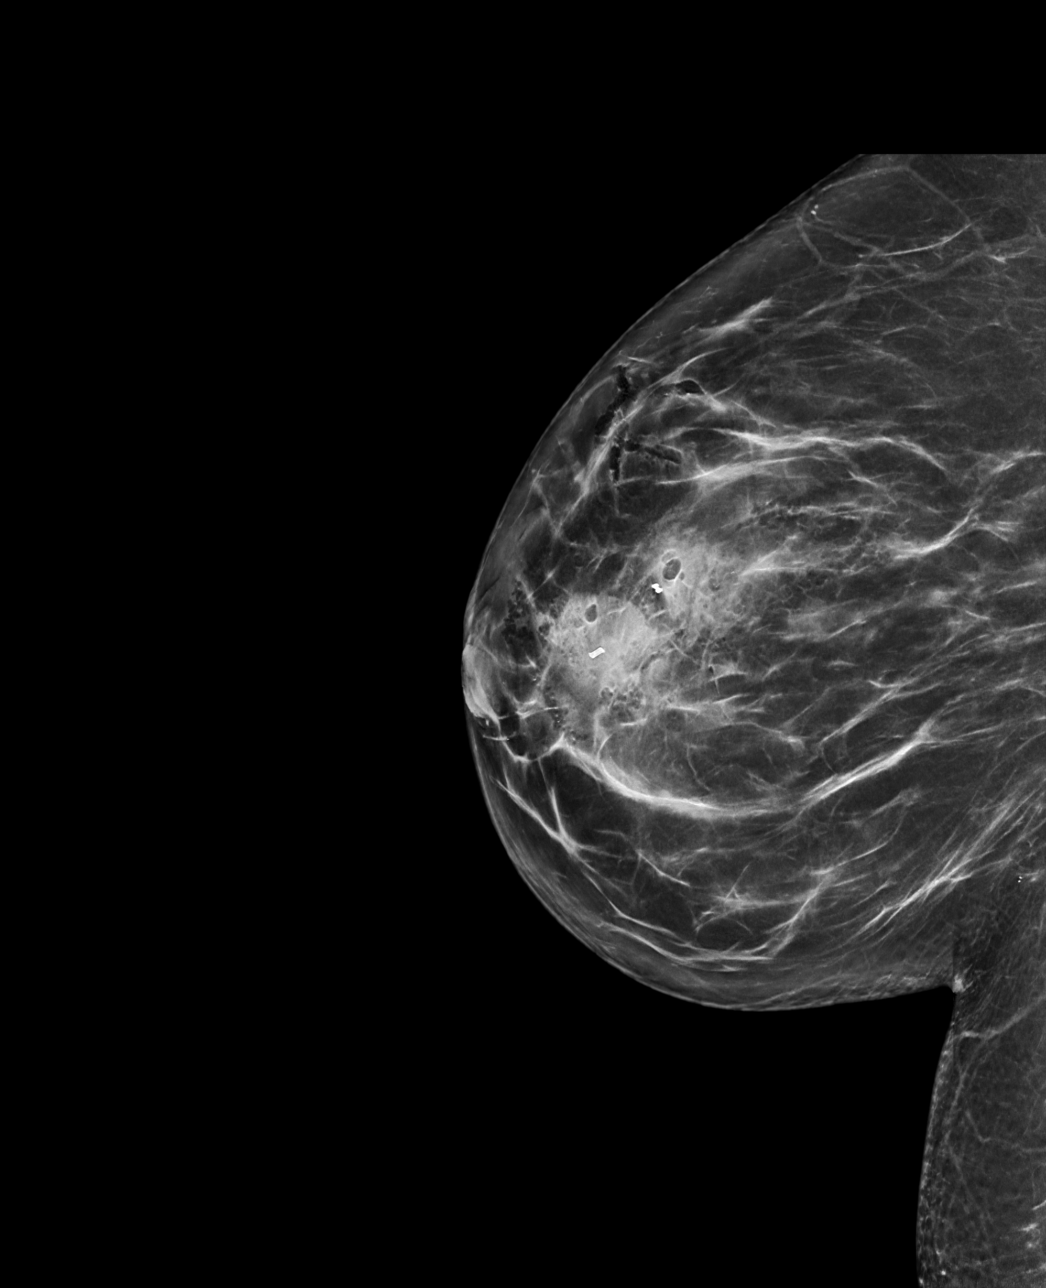

[R CC synth-2D]
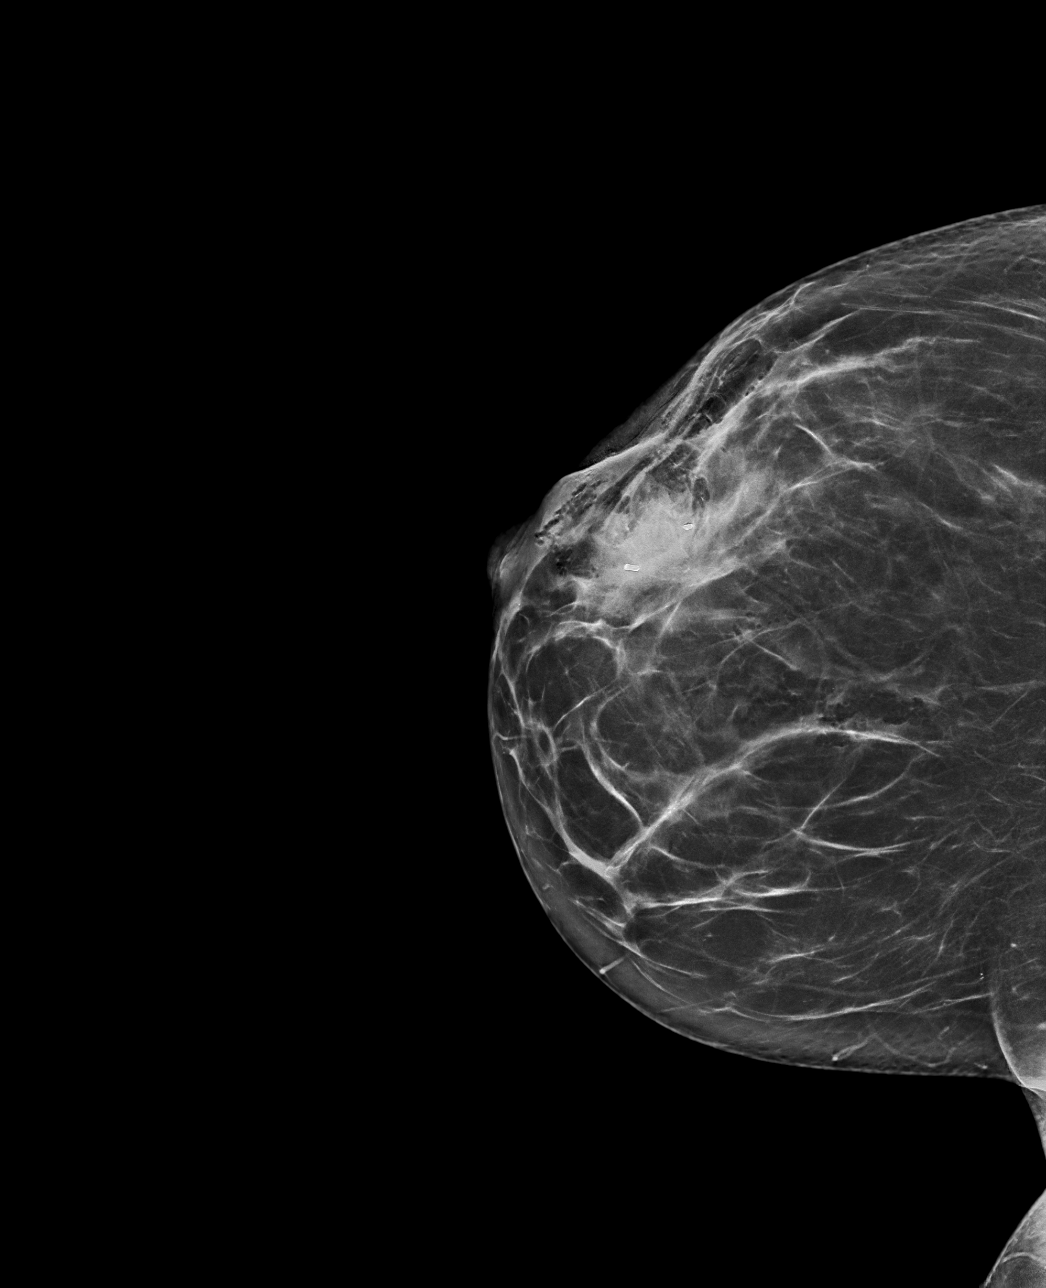

[R ML tomo · tomo slice 40/79.0]
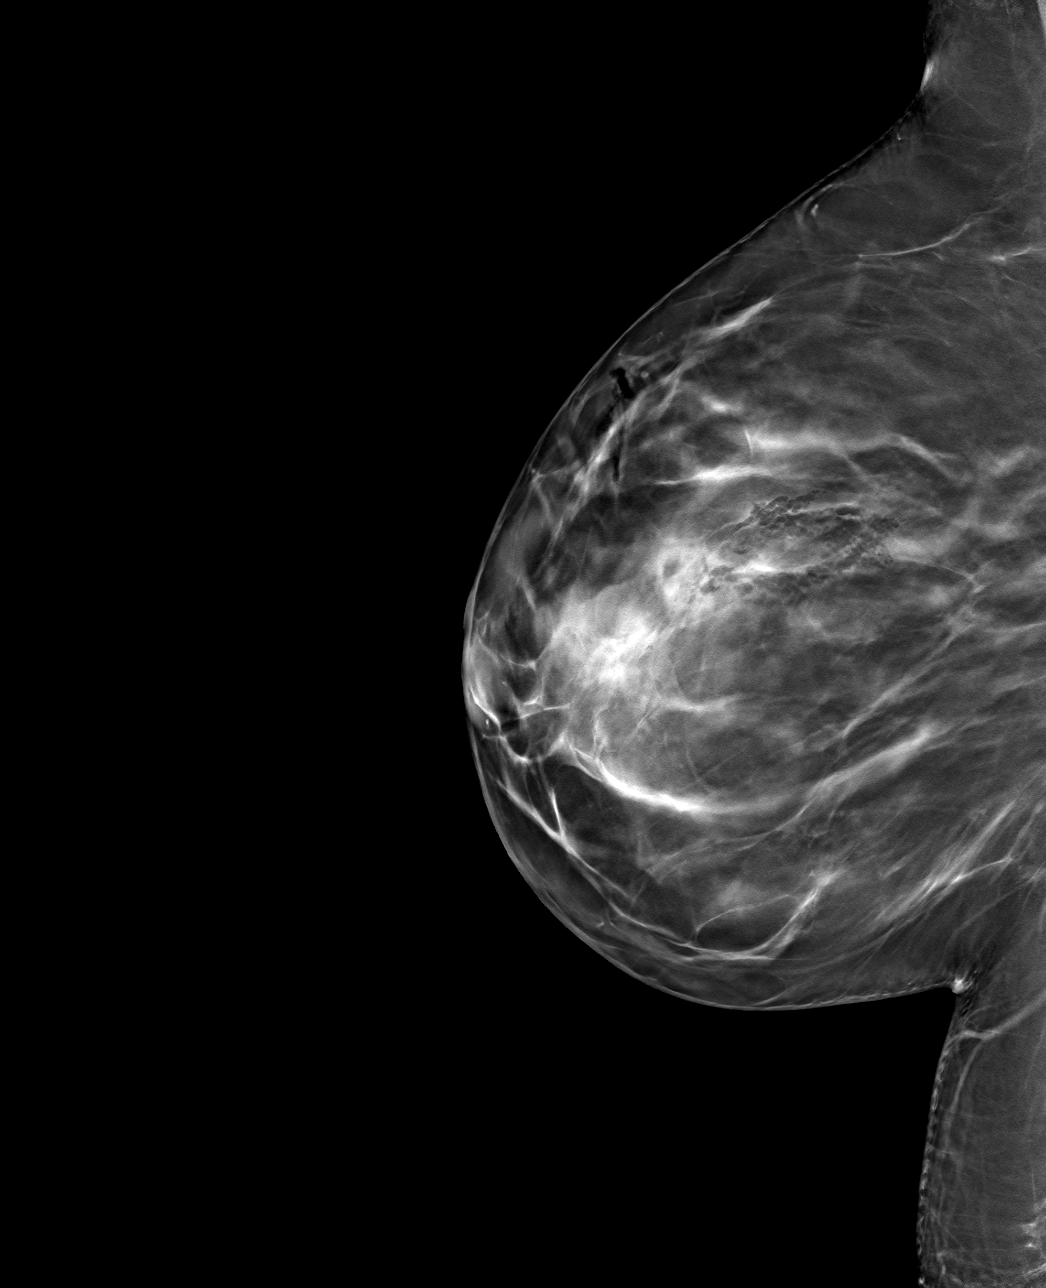

[R CC tomo · tomo slice 41/80.0]
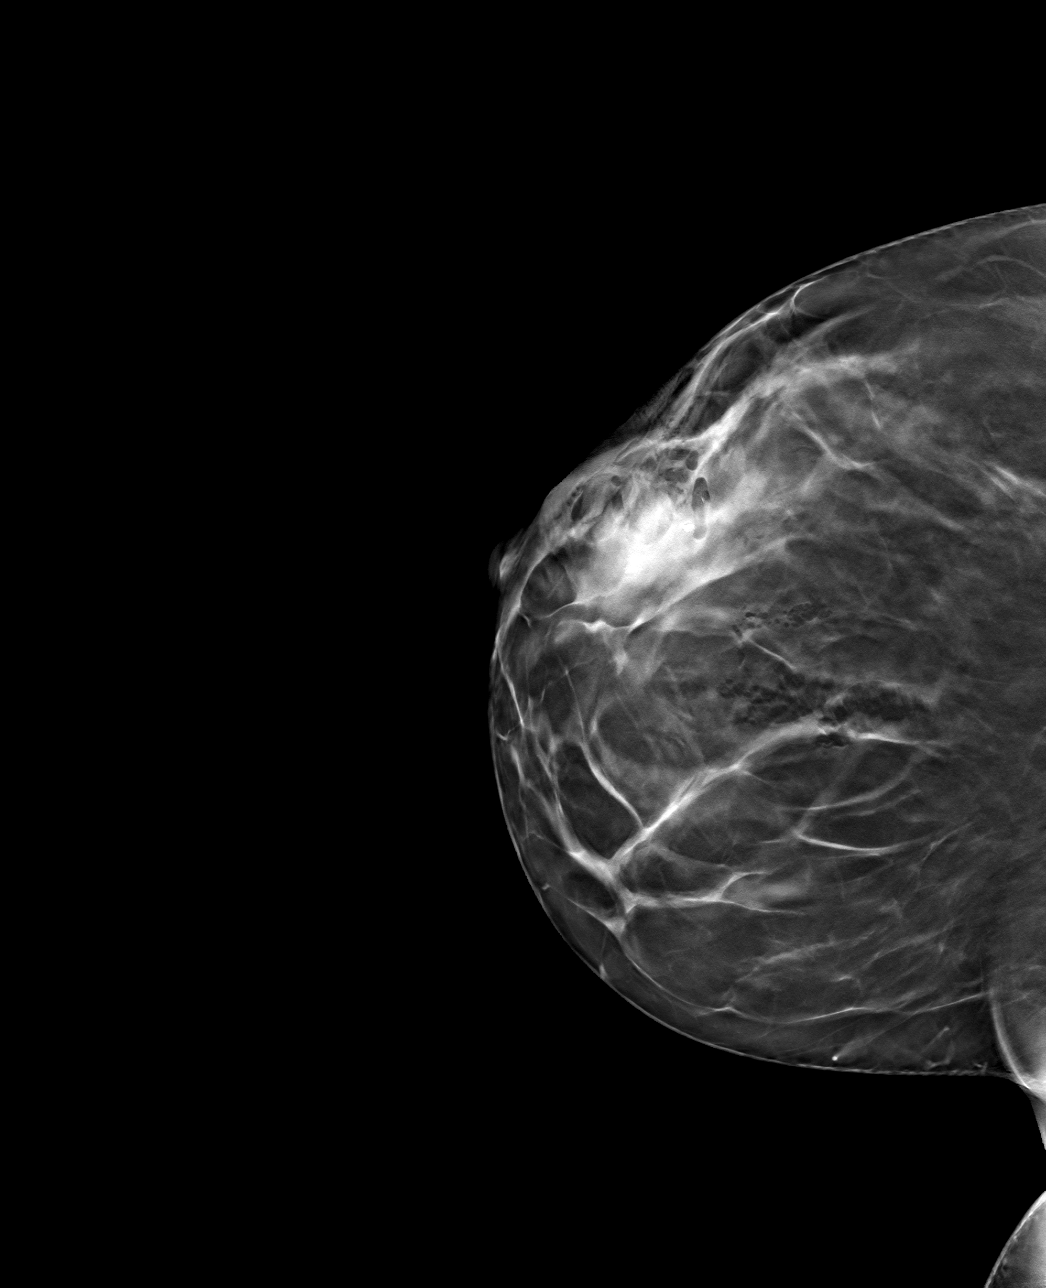

[4 of 12 positions shown; findings below may reference images not displayed]

FINDINGS: Mammographic images were obtained following MRI guided biopsy of
both breasts. The biopsy marking clip is in expected position at the
site of biopsy in the left breast. The 2 biopsy marking clips are in
the expected positions at the 2 sites of biopsy in the right breast.
IMPRESSION: 1. Appropriate positioning of the barbell shaped biopsy marking clip
at the site of biopsy in the left slightly outer lower quadrant.

2. Appropriate positioning of the barbell shaped biopsy marking clip
at the site of biopsy in the right upper outer quadrant middle
third.

3. Appropriate positioning of the cylinder shaped biopsy marking
clip at the site of biopsy in the right upper outer quadrant
anterior third.

Final Assessment: Post Procedure Mammograms for Marker Placement

## 2021-08-20 IMAGING — MR MR BREAST BX W/ LOC DEV EA ADD LESION IMAGE BX SPEC MR GUIDE*R*
7 of 12 series · 27 of 48 positions shown · IV contrast (gadavist)
Comparison: Previous exams.
COMPARISON: Previous exams.
COMPARISON: Previous exams.

Addendum:
CLINICAL DATA: Patient presents for MRI guided biopsy of 2 sites
right breast upper outer right periareolar region and 1 site left
breast outer lower quadrant. Recent history of left bloody nipple
discharge with negative mammographic/sonographic workup.

EXAM:
MRI GUIDED CORE NEEDLE BIOPSY OF THE BILATERAL BREAST
TECHNIQUE: Multiplanar, multisequence MR imaging of the bilateral breast was
performed both before and after administration of intravenous
contrast.
CONTRAST:  8 mL Gadavist

[Series 2: fiducial bilateral · sagittal · 2.0mm · 1.33mm/px · 4 of 160 slices shown]
[im 1/160]
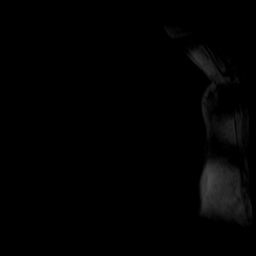
[im 54/160]
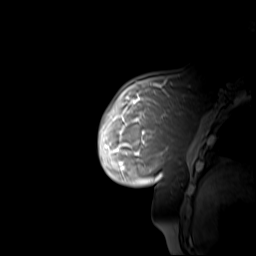
[im 107/160]
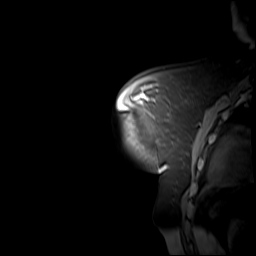
[im 160/160]
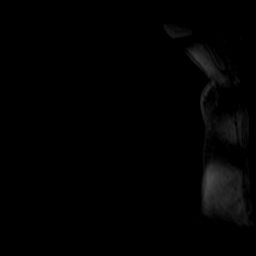

[Series 3: dynamic pre · axial · non-contrast · 1.3mm · 0.73mm/px · z∈[-67,+139]mm · 4 of 160 slices shown]
[im 1/160]
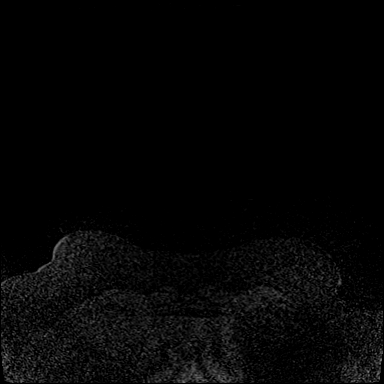
[im 54/160]
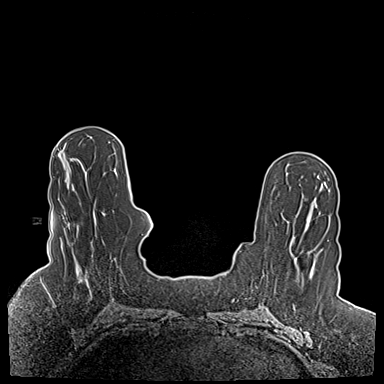
[im 107/160]
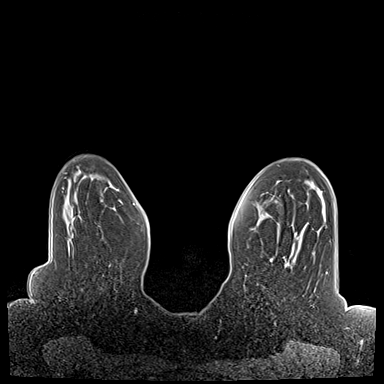
[im 160/160]
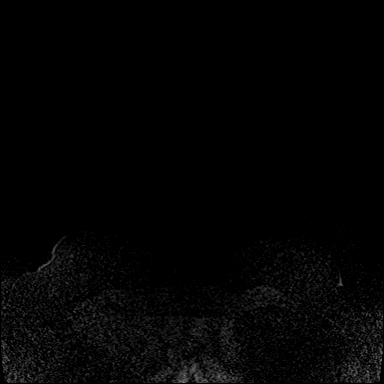

[Series 4: dynamic post 20 · axial · 1.3mm · 0.73mm/px · z∈[-67,+139]mm · 4 of 160 slices shown (1 of 2)]
[im 1/160]
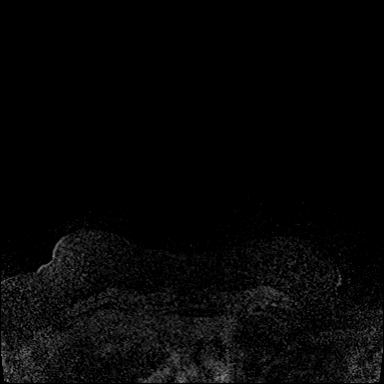
[im 54/160]
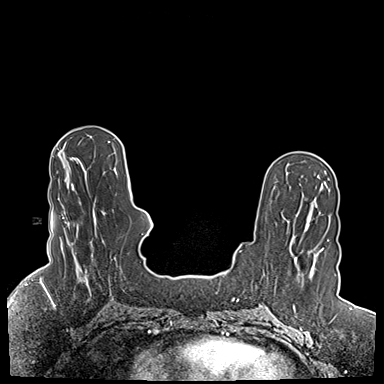
[im 107/160]
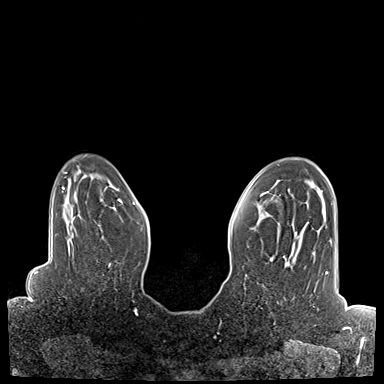
[im 160/160]
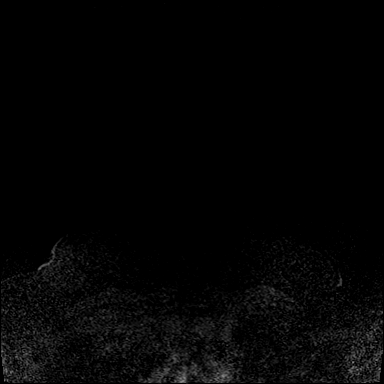

[Series 5: dynamic post 20 · axial · 1.3mm · 0.73mm/px · z∈[-67,+139]mm · 4 of 160 slices shown (2 of 2)]
[im 1/160]
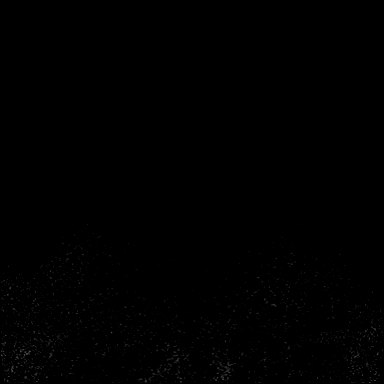
[im 54/160]
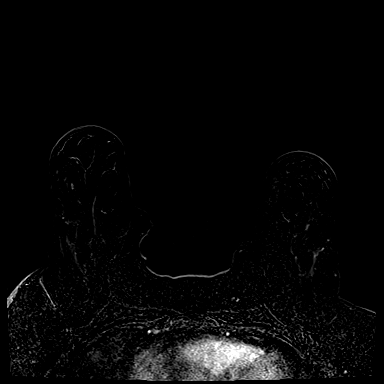
[im 107/160]
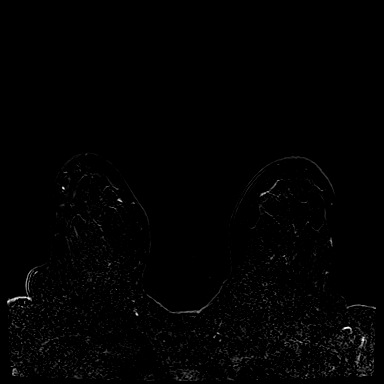
[im 160/160]
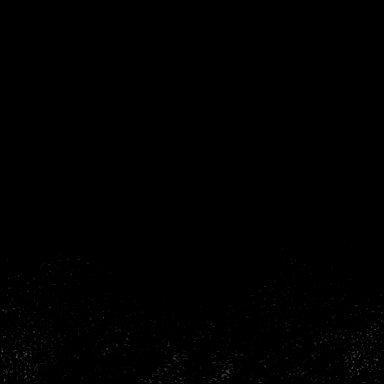

[Series 6: dynamic post 3 · axial · 1.3mm · 0.73mm/px · z∈[-67,+139]mm · 4 of 160 slices shown (1 of 2)]
[im 1/160]
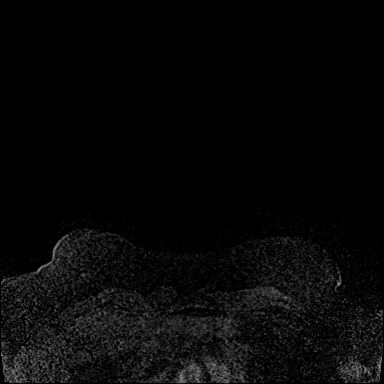
[im 54/160]
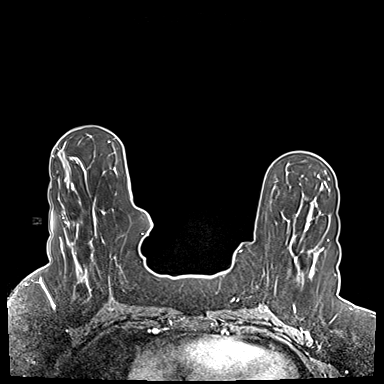
[im 107/160]
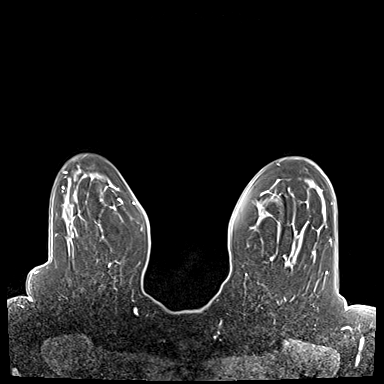
[im 160/160]
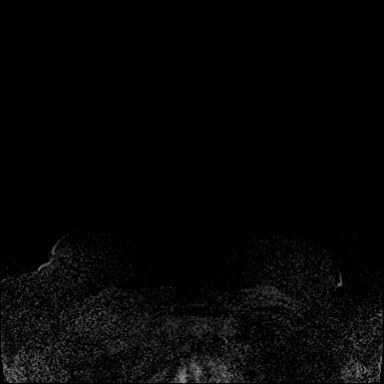

[Series 7: dynamic post 3 · axial · 1.3mm · 0.73mm/px · z∈[-67,+139]mm · 4 of 160 slices shown (2 of 2)]
[im 1/160]
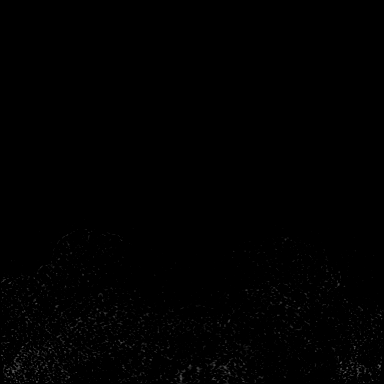
[im 54/160]
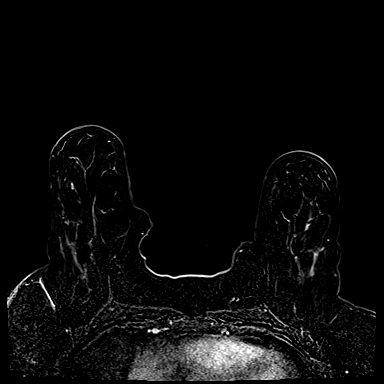
[im 107/160]
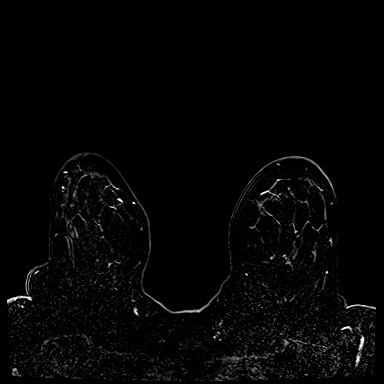
[im 160/160]
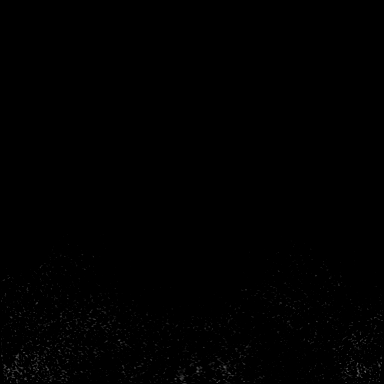

[Series 8: dynamic post 5 · axial · 1.3mm · 0.73mm/px · z∈[-67,+71]mm · 3 of 160 slices shown]
[im 1/160]
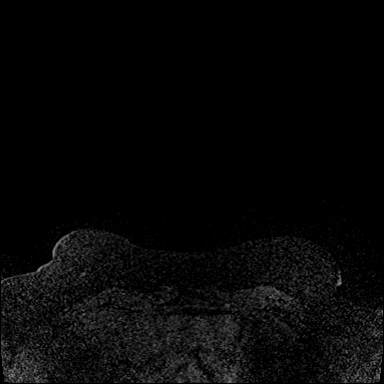
[im 54/160]
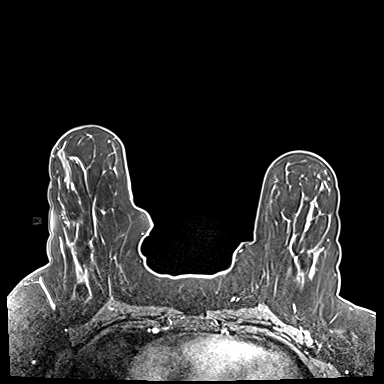
[im 107/160]
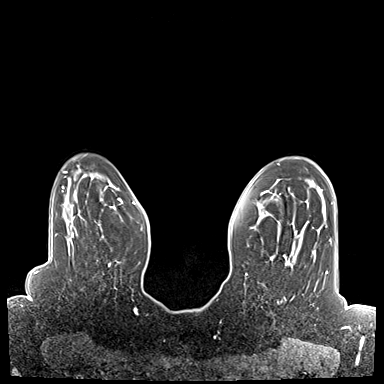

[27 of 48 positions shown; findings below may reference images not displayed]

FINDINGS: I met with the patient, and we discussed the procedure of MRI guided
biopsy, including risks, benefits, and alternatives. Specifically,
we discussed the risks of infection, bleeding, tissue injury, clip
migration, and inadequate sampling. Informed, written consent was
given. The usual time out protocol was performed immediately prior
to the procedure.

1. Using sterile technique, 1% Lidocaine, MRI guidance, and a 9
gauge vacuum assisted device, biopsy was performed of targeted 4 mm
indeterminate left breast mass over the outer lower quadrant using a
lateral to medial approach. At the conclusion of the procedure, a
cylinder shaped tissue marker clip was deployed into the biopsy
cavity. Follow-up 2-view mammogram was performed and dictated
separately.

2. Using sterile technique, 1% Lidocaine, MRI guidance, and a 9
gauge vacuum assisted device, biopsy was performed of targeted
cm non mass enhancement over the right upper outer quadrant (middle
third) using a lateral to medial approach. At the conclusion of the
procedure, a cylinder shaped tissue marker clip was deployed into
the biopsy cavity. Follow-up 2-view mammogram was performed and
dictated separately.

3. Using sterile technique, 1% Lidocaine, MRI guidance, and a 9
gauge vacuum assisted device, biopsy was performed of the targeted 8
mm non mass enhancement over the right upper outer quadrant
(anterior third) using a lateral to medial approach. At the
conclusion of the procedure, a barbell shaped tissue marker clip was
deployed into the biopsy cavity. Follow-up 2-view mammogram was
performed and dictated separately.
IMPRESSION: MRI guided biopsy of 2 sites right breast and single site left
breast as described. No apparent complications.

ADDENDUM:
Note that in the above report, the clip shape in the left breast is
actually a barbell shaped clip. The clip shape over the right breast
upper outer quadrant middle third is also a barbell clip in the clip
shape over the right breast upper outer quadrant anterior third is a
cylinder clip.

ADDENDUM:
Pathology revealed PSUEDOANGIOMATOUS STROMAL HYPERPLASIA (PASH) of
the LEFT breast, outer lower quadrant. This was found to be
concordant by Dr. Rtoyota Joshjax.

Pathology revealed FIBROCYSTIC CHANGE AND STROMAL FIBROSIS of the
RIGHT breast, upper outer quadrant, posterior. This was found to be
concordant by Dr. Rtoyota Joshjax.

Pathology revealed FIBROCYSTIC CHANGE AND STROMAL FIBROSIS of the
RIGHT breast, upper outer quadrant, anterior. This was found to be
concordant by Dr. Rtoyota Joshjax.

Pathology results were discussed with the patient by telephone. The
patient reported doing well after the biopsies with tenderness at
the sites, and bleeding on the RIGHT. Post biopsy instructions and
care were reviewed and questions were answered. The patient was
encouraged to call The [REDACTED] for any
additional concerns. My direct phone number was provided.

Surgical consultation, for further evaluation of LEFT bloody nipple
discharge, has been arranged with Dr. Klinik Hewan Bkone at [REDACTED] on May 12, 2020.

The patient was instructed to return for a bilateral breast MRI in 6
months, per protocol.

Pathology results reported by Rinelle Taplin, RN on 05/09/2020.

*** End of Addendum ***
Addendum:
FINDINGS: I met with the patient, and we discussed the procedure of MRI guided
biopsy, including risks, benefits, and alternatives. Specifically,
we discussed the risks of infection, bleeding, tissue injury, clip
migration, and inadequate sampling. Informed, written consent was
given. The usual time out protocol was performed immediately prior
to the procedure.

1. Using sterile technique, 1% Lidocaine, MRI guidance, and a 9
gauge vacuum assisted device, biopsy was performed of targeted 4 mm
indeterminate left breast mass over the outer lower quadrant using a
lateral to medial approach. At the conclusion of the procedure, a
cylinder shaped tissue marker clip was deployed into the biopsy
cavity. Follow-up 2-view mammogram was performed and dictated
separately.

2. Using sterile technique, 1% Lidocaine, MRI guidance, and a 9
gauge vacuum assisted device, biopsy was performed of targeted
cm non mass enhancement over the right upper outer quadrant (middle
third) using a lateral to medial approach. At the conclusion of the
procedure, a cylinder shaped tissue marker clip was deployed into
the biopsy cavity. Follow-up 2-view mammogram was performed and
dictated separately.

3. Using sterile technique, 1% Lidocaine, MRI guidance, and a 9
gauge vacuum assisted device, biopsy was performed of the targeted 8
mm non mass enhancement over the right upper outer quadrant
(anterior third) using a lateral to medial approach. At the
conclusion of the procedure, a barbell shaped tissue marker clip was
deployed into the biopsy cavity. Follow-up 2-view mammogram was
performed and dictated separately.
IMPRESSION: MRI guided biopsy of 2 sites right breast and single site left
breast as described. No apparent complications.

ADDENDUM:
Note that in the above report, the clip shape in the left breast is
actually a barbell shaped clip. The clip shape over the right breast
upper outer quadrant middle third is also a barbell clip in the clip
shape over the right breast upper outer quadrant anterior third is a
cylinder clip.

*** End of Addendum ***
FINDINGS: I met with the patient, and we discussed the procedure of MRI guided
biopsy, including risks, benefits, and alternatives. Specifically,
we discussed the risks of infection, bleeding, tissue injury, clip
migration, and inadequate sampling. Informed, written consent was
given. The usual time out protocol was performed immediately prior
to the procedure.

1. Using sterile technique, 1% Lidocaine, MRI guidance, and a 9
gauge vacuum assisted device, biopsy was performed of targeted 4 mm
indeterminate left breast mass over the outer lower quadrant using a
lateral to medial approach. At the conclusion of the procedure, a
cylinder shaped tissue marker clip was deployed into the biopsy
cavity. Follow-up 2-view mammogram was performed and dictated
separately.

2. Using sterile technique, 1% Lidocaine, MRI guidance, and a 9
gauge vacuum assisted device, biopsy was performed of targeted
cm non mass enhancement over the right upper outer quadrant (middle
third) using a lateral to medial approach. At the conclusion of the
procedure, a cylinder shaped tissue marker clip was deployed into
the biopsy cavity. Follow-up 2-view mammogram was performed and
dictated separately.

3. Using sterile technique, 1% Lidocaine, MRI guidance, and a 9
gauge vacuum assisted device, biopsy was performed of the targeted 8
mm non mass enhancement over the right upper outer quadrant
(anterior third) using a lateral to medial approach. At the
conclusion of the procedure, a barbell shaped tissue marker clip was
deployed into the biopsy cavity. Follow-up 2-view mammogram was
performed and dictated separately.
IMPRESSION: MRI guided biopsy of 2 sites right breast and single site left
breast as described. No apparent complications.

## 2021-10-06 ENCOUNTER — Other Ambulatory Visit: Payer: Self-pay | Admitting: Surgery

## 2021-10-06 DIAGNOSIS — Z1239 Encounter for other screening for malignant neoplasm of breast: Secondary | ICD-10-CM

## 2021-10-21 ENCOUNTER — Ambulatory Visit: Payer: BC Managed Care – PPO

## 2021-10-27 ENCOUNTER — Ambulatory Visit
Admission: RE | Admit: 2021-10-27 | Discharge: 2021-10-27 | Disposition: A | Discharge status: Home / Self Care | Payer: BC Managed Care – PPO | Source: Ambulatory Visit | Attending: Surgery | Admitting: Surgery

## 2021-10-27 DIAGNOSIS — Z1239 Encounter for other screening for malignant neoplasm of breast: Secondary | ICD-10-CM

## 2021-11-26 ENCOUNTER — Ambulatory Visit: Payer: BC Managed Care – PPO

## 2021-11-30 ENCOUNTER — Ambulatory Visit
Admission: RE | Admit: 2021-11-30 | Discharge: 2021-11-30 | Disposition: A | Discharge status: Home / Self Care | Payer: BC Managed Care – PPO | Source: Ambulatory Visit | Attending: Surgery | Admitting: Surgery

## 2021-11-30 DIAGNOSIS — Z1239 Encounter for other screening for malignant neoplasm of breast: Secondary | ICD-10-CM

## 2021-11-30 DIAGNOSIS — N644 Mastodynia: Secondary | ICD-10-CM | POA: Diagnosis not present

## 2021-11-30 MED ORDER — GADOBUTROL 1 MMOL/ML IV SOLN
9.0000 mL | Freq: Once | INTRAVENOUS | Status: AC | PRN
  Administered 2021-11-30: 9 mL via INTRAVENOUS

## 2021-12-09 ENCOUNTER — Other Ambulatory Visit: Payer: Self-pay | Admitting: Surgery

## 2021-12-09 DIAGNOSIS — R928 Other abnormal and inconclusive findings on diagnostic imaging of breast: Secondary | ICD-10-CM

## 2021-12-23 ENCOUNTER — Ambulatory Visit
Admission: RE | Admit: 2021-12-23 | Discharge: 2021-12-23 | Disposition: A | Discharge status: Home / Self Care | Payer: BC Managed Care – PPO | Source: Ambulatory Visit | Attending: Surgery | Admitting: Surgery

## 2021-12-23 ENCOUNTER — Other Ambulatory Visit: Payer: Self-pay | Admitting: Surgery

## 2021-12-23 ENCOUNTER — Ambulatory Visit: Payer: BC Managed Care – PPO

## 2021-12-23 DIAGNOSIS — R928 Other abnormal and inconclusive findings on diagnostic imaging of breast: Secondary | ICD-10-CM

## 2021-12-23 DIAGNOSIS — N644 Mastodynia: Secondary | ICD-10-CM | POA: Diagnosis not present

## 2022-01-21 DIAGNOSIS — F419 Anxiety disorder, unspecified: Secondary | ICD-10-CM | POA: Diagnosis not present

## 2022-01-21 DIAGNOSIS — H6992 Unspecified Eustachian tube disorder, left ear: Secondary | ICD-10-CM | POA: Diagnosis not present

## 2022-01-21 DIAGNOSIS — J302 Other seasonal allergic rhinitis: Secondary | ICD-10-CM | POA: Diagnosis not present

## 2022-04-26 DIAGNOSIS — Z1239 Encounter for other screening for malignant neoplasm of breast: Secondary | ICD-10-CM | POA: Diagnosis not present

## 2022-04-26 DIAGNOSIS — Z803 Family history of malignant neoplasm of breast: Secondary | ICD-10-CM | POA: Diagnosis not present

## 2022-06-21 ENCOUNTER — Telehealth: Payer: BC Managed Care – PPO | Admitting: Family Medicine

## 2022-06-21 DIAGNOSIS — R3989 Other symptoms and signs involving the genitourinary system: Secondary | ICD-10-CM

## 2022-06-21 MED ORDER — NITROFURANTOIN MONOHYD MACRO 100 MG PO CAPS
100.0000 mg | ORAL_CAPSULE | Freq: Two times a day (BID) | ORAL | 0 refills | Status: AC
Start: 2022-06-21 — End: 2022-06-26

## 2022-06-21 NOTE — Progress Notes (Signed)
Virtual Visit Consent   Patricia Franco, you are scheduled for a virtual visit with a [facility] provider today. Just as with appointments in the office, your consent must be obtained to participate. Your consent will be active for this visit and any virtual visit you may have with one of our providers in the next 365 days. If you have a MyChart account, a copy of this consent can be sent to you electronically.  As this is a virtual visit, video technology does not allow for your provider to perform a traditional examination. This may limit your provider's ability to fully assess your condition. If your provider identifies any concerns that need to be evaluated in person or the need to arrange testing (such as labs, EKG, etc.), we will make arrangements to do so. Although advances in technology are sophisticated, we cannot ensure that it will always work on either your end or our end. If the connection with a video visit is poor, the visit may have to be switched to a telephone visit. With either a video or telephone visit, we are not always able to ensure that we have a secure connection.  By engaging in this virtual visit, you consent to the provision of healthcare and authorize for your insurance to be billed (if applicable) for the services provided during this visit. Depending on your insurance coverage, you may receive a charge related to this service.  I need to obtain your verbal consent now. Are you willing to proceed with your visit today? Khila Yellock Burmester has provided verbal consent on 06/21/2022 for a virtual visit (video or telephone). [name], NP  Date: 06/21/2022 11:01 AM  Virtual Visit via Video Note   I, [name], connected with  Patricia KELLOG  ([id number], [birth year]) on 06/21/22 at 11:00 AM EST by a video-enabled telemedicine application and verified that I am speaking with the correct person using two identifiers.  Location: Patient: Virtual Visit Location  Patient: Home Provider: Virtual Visit Location Provider: Home Office   I discussed the limitations of evaluation and management by telemedicine and the availability of in person appointments. The patient expressed understanding and agreed to proceed.    History of Present Illness: Patricia Franco is a 42 y.o. who identifies as a female who was assigned female at birth, and is being seen today for UTI symptoms  HPI: Urinary Tract Infection  This is a new problem. Episode onset: 3 days ago. The problem occurs every urination. The problem has been gradually worsening. The quality of the pain is described as burning. The pain is at a severity of 7/10. The pain is moderate. There has been no fever (99.9). She is Sexually active. History of pyelonephritis: once- 10 years- pregnancy. Associated symptoms include frequency, hesitancy and urgency. Pertinent negatives include no chills, discharge, flank pain, hematuria, nausea, possible pregnancy, sweats or vomiting. She has tried increased fluids and acetaminophen for the symptoms. The treatment provided no relief.    Problems:  Patient Active Problem List   Diagnosis Date Noted   Genetic testing 01/28/2021   Family history of breast cancer 12/24/2020   Family history of ovarian cancer 12/24/2020   Family history of thyroid cancer 12/24/2020   Family history of colon cancer 12/24/2020   Family history of lung cancer 12/24/2020   SVD (spontaneous vaginal delivery) 01/07/2014   Postpartum care following vaginal delivery (9/21) 01/07/2014   Pregnancy 01/06/2014   Migraine headache with aura 10/06/2013  Allergies:  Allergies  Allergen Reactions   Bee Venom Anaphylaxis and Swelling   Penicillins    Penicillins Hives and Swelling   Medications:  Current Outpatient Medications:    EPINEPHrine (EPIPEN IJ), Inject 1 each as directed as needed (for anaphylaxis)., Disp: , Rfl:    ibuprofen (ADVIL,MOTRIN) 600 MG tablet, Take 1 tablet (600 mg total)  by mouth every 6 (six) hours., Disp: 30 tablet, Rfl: 0   oxyCODONE-acetaminophen (PERCOCET/ROXICET) 5-325 MG per tablet, Take 1-2 tablets by mouth every 4 (four) hours as needed (for pain scale equal to or greater than 7)., Disp: 30 tablet, Rfl: 0   predniSONE (DELTASONE) 20 MG tablet, 2 tabs po daily x 4 days, Disp: 8 tablet, Rfl: 0   Prenatal Vit-Fe Fumarate-FA (PRENATAL MULTIVITAMIN) TABS tablet, Take 1 tablet by mouth daily., Disp: , Rfl:   Observations/Objective: Patient is well-developed, well-nourished in no acute distress.  Resting comfortably  at home.  Head is normocephalic, atraumatic.  No labored breathing.  Speech is clear and coherent with logical content.  Patient is alert and oriented at baseline.    Assessment and Plan:  1. Suspected UTI  - nitrofurantoin, macrocrystal-monohydrate, (MACROBID) 100 MG capsule; Take 1 capsule (100 mg total) by mouth 2 (two) times daily for 5 days.  Dispense: 10 capsule; Refill: 0  -review of UTI and causes- info on AVS -complete medication in full   Reviewed side effects, risks and benefits of medication.    Patient acknowledged agreement and understanding of the plan.  Past Medical, Surgical, Social History, Allergies, and Medications have been Reviewed.     Follow Up Instructions: I discussed the assessment and treatment plan with the patient. The patient was provided an opportunity to ask questions and all were answered. The patient agreed with the plan and demonstrated an understanding of the instructions.  A copy of instructions were sent to the patient via MyChart unless otherwise noted below.    The patient was advised to call back or seek an in-person evaluation if the symptoms worsen or if the condition fails to improve as anticipated.  Time:  I spent 10 minutes with the patient via telehealth technology discussing the above problems/concerns.    [name], NP

## 2022-06-21 NOTE — Patient Instructions (Signed)
Janey Genta, thank you for joining [name], NP for today's virtual visit.  While this provider is not your primary care provider (PCP), if your PCP is located in our provider database this encounter information will be shared with them immediately following your visit.   [facility] account gives you access to today's visit and all your visits, tests, and labs performed at [facility] " click here if you don't have a [facility] account or go to [email or web address]  Consent: (Patient) Patricia Franco provided verbal consent for this virtual visit at the beginning of the encounter.  Current Medications:  Current Outpatient Medications:    nitrofurantoin, macrocrystal-monohydrate, (MACROBID) 100 MG capsule, Take 1 capsule (100 mg total) by mouth 2 (two) times daily for 5 days., Disp: 10 capsule, Rfl: 0   EPINEPHrine (EPIPEN IJ), Inject 1 each as directed as needed (for anaphylaxis)., Disp: , Rfl:    ibuprofen (ADVIL,MOTRIN) 600 MG tablet, Take 1 tablet (600 mg total) by mouth every 6 (six) hours., Disp: 30 tablet, Rfl: 0   oxyCODONE-acetaminophen (PERCOCET/ROXICET) 5-325 MG per tablet, Take 1-2 tablets by mouth every 4 (four) hours as needed (for pain scale equal to or greater than 7)., Disp: 30 tablet, Rfl: 0   predniSONE (DELTASONE) 20 MG tablet, 2 tabs po daily x 4 days, Disp: 8 tablet, Rfl: 0   Prenatal Vit-Fe Fumarate-FA (PRENATAL MULTIVITAMIN) TABS tablet, Take 1 tablet by mouth daily., Disp: , Rfl:    Medications ordered in this encounter:  Meds ordered this encounter  Medications   nitrofurantoin, macrocrystal-monohydrate, (MACROBID) 100 MG capsule    Sig: Take 1 capsule (100 mg total) by mouth 2 (two) times daily for 5 days.    Dispense:  10 capsule    Refill:  0    Order Specific Question:   Supervising Provider    Answer:   Chase Picket [id number]     *If you need refills on other medications prior to your next  appointment, please contact your pharmacy*  Follow-Up: Call back or seek an in-person evaluation if the symptoms worsen or if the condition fails to improve as anticipated.  [facility] [phone]  Other Instructions Urinary Tract Infection, Adult  A urinary tract infection (UTI) is an infection of any part of the urinary tract. The urinary tract includes the kidneys, ureters, bladder, and urethra. These organs make, store, and get rid of urine in the body. An upper UTI affects the ureters and kidneys. A lower UTI affects the bladder and urethra. What are the causes? Most urinary tract infections are caused by bacteria in your genital area around your urethra, where urine leaves your body. These bacteria grow and cause inflammation of your urinary tract. What increases the risk? You are more likely to develop this condition if: You have a urinary catheter that stays in place. You are not able to control when you urinate or have a bowel movement (incontinence). You are female and you: Use a spermicide or diaphragm for birth control. Have low estrogen levels. Are pregnant. You have certain genes that increase your risk. You are sexually active. You take antibiotic medicines. You have a condition that causes your flow of urine to slow down, such as: An enlarged prostate, if you are female. Blockage in your urethra. A kidney stone. A nerve condition that affects your bladder control (neurogenic bladder). Not getting enough to drink, or not urinating often. You have certain medical conditions, such  as: Diabetes. A weak disease-fighting system (immunesystem). Sickle cell disease. Gout. Spinal cord injury. What are the signs or symptoms? Symptoms of this condition include: Needing to urinate right away (urgency). Frequent urination. This may include small amounts of urine each time you urinate. Pain or burning with urination. Blood in the urine. Urine that smells  bad or unusual. Trouble urinating. Cloudy urine. Vaginal discharge, if you are female. Pain in the abdomen or the lower back. You may also have: Vomiting or a decreased appetite. Confusion. Irritability or tiredness. A fever or chills. Diarrhea. The first symptom in older adults may be confusion. In some cases, they may not have any symptoms until the infection has worsened. How is this diagnosed? This condition is diagnosed based on your medical history and a physical exam. You may also have other tests, including: Urine tests. Blood tests. Tests for STIs (sexually transmitted infections). If you have had more than one UTI, a cystoscopy or imaging studies may be done to determine the cause of the infections. How is this treated? Treatment for this condition includes: Antibiotic medicine. Over-the-counter medicines to treat discomfort. Drinking enough water to stay hydrated. If you have frequent infections or have other conditions such as a kidney stone, you may need to see a health care provider who specializes in the urinary tract (urologist). In rare cases, urinary tract infections can cause sepsis. Sepsis is a life-threatening condition that occurs when the body responds to an infection. Sepsis is treated in the hospital with IV antibiotics, fluids, and other medicines. Follow these instructions at home:  Medicines Take over-the-counter and prescription medicines only as told by your health care provider. If you were prescribed an antibiotic medicine, take it as told by your health care provider. Do not stop using the antibiotic even if you start to feel better. General instructions Make sure you: Empty your bladder often and completely. Do not hold urine for long periods of time. Empty your bladder after sex. Wipe from front to back after urinating or having a bowel movement if you are female. Use each tissue only one time when you wipe. Drink enough fluid to keep your urine  pale yellow. Keep all follow-up visits. This is important. Contact a health care provider if: Your symptoms do not get better after 1-2 days. Your symptoms go away and then return. Get help right away if: You have severe pain in your back or your lower abdomen. You have a fever or chills. You have nausea or vomiting. Summary A urinary tract infection (UTI) is an infection of any part of the urinary tract, which includes the kidneys, ureters, bladder, and urethra. Most urinary tract infections are caused by bacteria in your genital area. Treatment for this condition often includes antibiotic medicines. If you were prescribed an antibiotic medicine, take it as told by your health care provider. Do not stop using the antibiotic even if you start to feel better. Keep all follow-up visits. This is important. This information is not intended to replace advice given to you by your health care provider. Make sure you discuss any questions you have with your health care provider. Document Revised: 11/16/2019 Document Reviewed: 11/16/2019 Elsevier Patient Education  [place].   If you have been instructed to have an in-person evaluation today at a local Urgent Care facility, please use the link below. It will take you to a list of all of our available [facility], including address, phone number and hours of operation. Please  do not delay care.  [facility]  If you or a family member do not have a primary care provider, use the link below to schedule a visit and establish care. When you choose a [facility] primary care physician or advanced practice provider, you gain a long-term partner in health. Find a Primary Care Provider  Learn more about [facility]'s in-office and virtual care options: [facility]

## 2022-06-28 DIAGNOSIS — Z30433 Encounter for removal and reinsertion of intrauterine contraceptive device: Secondary | ICD-10-CM | POA: Diagnosis not present

## 2022-08-12 DIAGNOSIS — M222X1 Patellofemoral disorders, right knee: Secondary | ICD-10-CM | POA: Diagnosis not present

## 2022-08-23 ENCOUNTER — Telehealth: Payer: BC Managed Care – PPO | Admitting: Physician Assistant

## 2022-08-23 DIAGNOSIS — R3989 Other symptoms and signs involving the genitourinary system: Secondary | ICD-10-CM | POA: Diagnosis not present

## 2022-08-23 MED ORDER — NITROFURANTOIN MONOHYD MACRO 100 MG PO CAPS: 100.0000 mg | ORAL_CAPSULE | Freq: Two times a day (BID) | ORAL | 0 refills | Status: DC

## 2022-08-23 NOTE — Progress Notes (Signed)
Virtual Visit Consent   [name], you are scheduled for a virtual visit with a [facility] provider today. Just as with appointments in the office, your consent must be obtained to participate. Your consent will be active for this visit and any virtual visit you may have with one of our providers in the next 365 days. If you have a MyChart account, a copy of this consent can be sent to you electronically.  As this is a virtual visit, video technology does not allow for your provider to perform a traditional examination. This may limit your provider's ability to fully assess your condition. If your provider identifies any concerns that need to be evaluated in person or the need to arrange testing (such as labs, EKG, etc.), we will make arrangements to do so. Although advances in technology are sophisticated, we cannot ensure that it will always work on either your end or our end. If the connection with a video visit is poor, the visit may have to be switched to a telephone visit. With either a video or telephone visit, we are not always able to ensure that we have a secure connection.  By engaging in this virtual visit, you consent to the provision of healthcare and authorize for your insurance to be billed (if applicable) for the services provided during this visit. Depending on your insurance coverage, you may receive a charge related to this service.  I need to obtain your verbal consent now. Are you willing to proceed with your visit today? Patricia Franco has provided verbal consent on 08/23/2022 for a virtual visit (video or telephone). Patricia Loveless, PA-C  Date: 08/23/2022 8:09 AM  Virtual Visit via Video Note   I, Patricia Franco, connected with  Patricia Franco  ([id number], 1980/06/25) on 08/23/22 at  8:15 AM EDT by a video-enabled telemedicine application and verified that I am speaking with the correct person using two identifiers.  Location: Patient: Virtual Visit  Location Patient: Home Provider: Virtual Visit Location Provider: Home Office   I discussed the limitations of evaluation and management by telemedicine and the availability of in person appointments. The patient expressed understanding and agreed to proceed.    History of Present Illness: Patricia Franco is a 42 y.o. who identifies as a female who was assigned female at birth, and is being seen today for possible UTI.  HPI: Urinary Tract Infection  This is a new problem. The current episode started yesterday. The problem occurs every urination. The problem has been gradually worsening. The quality of the pain is described as aching, burning and stabbing. The pain is moderate. There has been no fever. Associated symptoms include frequency, hesitancy and urgency. Pertinent negatives include no chills, discharge, flank pain, hematuria, nausea or vomiting. She has tried increased fluids and acetaminophen (AZO) for the symptoms. The treatment provided no relief. 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     Problems:  Patient Active Problem List   Diagnosis Date Noted   Genetic testing 01/28/2021   Family history of breast cancer 12/24/2020   Family history of ovarian cancer 12/24/2020   Family history of thyroid cancer 12/24/2020   Family history of colon cancer 12/24/2020   Family history of lung cancer 12/24/2020   SVD (spontaneous vaginal delivery) 01/07/2014   Postpartum care following vaginal delivery (9/21) 01/07/2014   Pregnancy 01/06/2014   Migraine headache with aura 10/06/2013    Allergies:  Allergies  Allergen Reactions  Bee Venom Anaphylaxis and Swelling   Penicillins    Penicillins Hives and Swelling   Medications:  Current Outpatient Medications:    nitrofurantoin, macrocrystal-monohydrate, (MACROBID) 100 MG capsule, Take 1 capsule (100 mg total) by mouth 2 (two) times daily., Disp: 10 capsule, Rfl: 0   EPINEPHrine (EPIPEN IJ),  Inject 1 each as directed as needed (for anaphylaxis)., Disp: , Rfl:    ibuprofen (ADVIL,MOTRIN) 600 MG tablet, Take 1 tablet (600 mg total) by mouth every 6 (six) hours., Disp: 30 tablet, Rfl: 0   oxyCODONE-acetaminophen (PERCOCET/ROXICET) 5-325 MG per tablet, Take 1-2 tablets by mouth every 4 (four) hours as needed (for pain scale equal to or greater than 7)., Disp: 30 tablet, Rfl: 0   predniSONE (DELTASONE) 20 MG tablet, 2 tabs po daily x 4 days, Disp: 8 tablet, Rfl: 0   Prenatal Vit-Fe Fumarate-FA (PRENATAL MULTIVITAMIN) TABS tablet, Take 1 tablet by mouth daily., Disp: , Rfl:   Observations/Objective: Patient is well-developed, well-nourished in no acute distress.  Resting comfortably  Head is normocephalic, atraumatic.  No labored breathing.  Speech is clear and coherent with logical content.  Patient is alert and oriented at baseline.    Assessment and Plan: 1. Suspected UTI - nitrofurantoin, macrocrystal-monohydrate, (MACROBID) 100 MG capsule; Take 1 capsule (100 mg total) by mouth 2 (two) times daily.  Dispense: 10 capsule; Refill: 0  - Worsening symptoms.  - Will treat empirically with Macrobid - May use AZO for bladder spasms - Continue to push fluids.  - Seek in person evaluation for urine culture if symptoms do not improve or if they worsen.    Follow Up Instructions: I discussed the assessment and treatment plan with the patient. The patient was provided an opportunity to ask questions and all were answered. The patient agreed with the plan and demonstrated an understanding of the instructions.  A copy of instructions were sent to the patient via MyChart unless otherwise noted below.    The patient was advised to call back or seek an in-person evaluation if the symptoms worsen or if the condition fails to improve as anticipated.  Time:  I spent 8 minutes with the patient via telehealth technology discussing the above problems/concerns.    Patricia Loveless, PA-C

## 2022-08-23 NOTE — Patient Instructions (Signed)
[name], thank you for joining [name], PA-C for today's virtual visit.  While this provider is not your primary care provider (PCP), if your PCP is located in our provider database this encounter information will be shared with them immediately following your visit.   A [facility] MyChart account gives you access to today's visit and all your visits, tests, and labs performed at [facility] " click here if you don't have a [facility] MyChart account or go to [email or web address]  Consent: (Patient) Patricia Franco provided verbal consent for this virtual visit at the beginning of the encounter.  Current Medications:  Current Outpatient Medications:    nitrofurantoin, macrocrystal-monohydrate, (MACROBID) 100 MG capsule, Take 1 capsule (100 mg total) by mouth 2 (two) times daily., Disp: 10 capsule, Rfl: 0   EPINEPHrine (EPIPEN IJ), Inject 1 each as directed as needed (for anaphylaxis)., Disp: , Rfl:    ibuprofen (ADVIL,MOTRIN) 600 MG tablet, Take 1 tablet (600 mg total) by mouth every 6 (six) hours., Disp: 30 tablet, Rfl: 0   oxyCODONE-acetaminophen (PERCOCET/ROXICET) 5-325 MG per tablet, Take 1-2 tablets by mouth every 4 (four) hours as needed (for pain scale equal to or greater than 7)., Disp: 30 tablet, Rfl: 0   predniSONE (DELTASONE) 20 MG tablet, 2 tabs po daily x 4 days, Disp: 8 tablet, Rfl: 0   Prenatal Vit-Fe Fumarate-FA (PRENATAL MULTIVITAMIN) TABS tablet, Take 1 tablet by mouth daily., Disp: , Rfl:    Medications ordered in this encounter:  Meds ordered this encounter  Medications   nitrofurantoin, macrocrystal-monohydrate, (MACROBID) 100 MG capsule    Sig: Take 1 capsule (100 mg total) by mouth 2 (two) times daily.    Dispense:  10 capsule    Refill:  0    Order Specific Question:   Supervising Provider    Answer:   Merrilee Jansky [id number]     *If you need refills on other medications prior to your next appointment, please  contact your pharmacy*  Follow-Up: Call back or seek an in-person evaluation if the symptoms worsen or if the condition fails to improve as anticipated.  [facility] [phone]  Other Instructions Urinary Tract Infection, Adult  A urinary tract infection (UTI) is an infection of any part of the urinary tract. The urinary tract includes the kidneys, ureters, bladder, and urethra. These organs make, store, and get rid of urine in the body. An upper UTI affects the ureters and kidneys. A lower UTI affects the bladder and urethra. What are the causes? Most urinary tract infections are caused by bacteria in your genital area around your urethra, where urine leaves your body. These bacteria grow and cause inflammation of your urinary tract. What increases the risk? You are more likely to develop this condition if: You have a urinary catheter that stays in place. You are not able to control when you urinate or have a bowel movement (incontinence). You are female and you: Use a spermicide or diaphragm for birth control. Have low estrogen levels. Are pregnant. You have certain genes that increase your risk. You are sexually active. You take antibiotic medicines. You have a condition that causes your flow of urine to slow down, such as: An enlarged prostate, if you are female. Blockage in your urethra. A kidney stone. A nerve condition that affects your bladder control (neurogenic bladder). Not getting enough to drink, or not urinating often. You have certain medical conditions, such as: Diabetes. A weak disease-fighting system (  immunesystem). Sickle cell disease. Gout. Spinal cord injury. What are the signs or symptoms? Symptoms of this condition include: Needing to urinate right away (urgency). Frequent urination. This may include small amounts of urine each time you urinate. Pain or burning with urination. Blood in the urine. Urine that smells bad or  unusual. Trouble urinating. Cloudy urine. Vaginal discharge, if you are female. Pain in the abdomen or the lower back. You may also have: Vomiting or a decreased appetite. Confusion. Irritability or tiredness. A fever or chills. Diarrhea. The first symptom in older adults may be confusion. In some cases, they may not have any symptoms until the infection has worsened. How is this diagnosed? This condition is diagnosed based on your medical history and a physical exam. You may also have other tests, including: Urine tests. Blood tests. Tests for STIs (sexually transmitted infections). If you have had more than one UTI, a cystoscopy or imaging studies may be done to determine the cause of the infections. How is this treated? Treatment for this condition includes: Antibiotic medicine. Over-the-counter medicines to treat discomfort. Drinking enough water to stay hydrated. If you have frequent infections or have other conditions such as a kidney stone, you may need to see a health care provider who specializes in the urinary tract (urologist). In rare cases, urinary tract infections can cause sepsis. Sepsis is a life-threatening condition that occurs when the body responds to an infection. Sepsis is treated in the hospital with IV antibiotics, fluids, and other medicines. Follow these instructions at home:  Medicines Take over-the-counter and prescription medicines only as told by your health care provider. If you were prescribed an antibiotic medicine, take it as told by your health care provider. Do not stop using the antibiotic even if you start to feel better. General instructions Make sure you: Empty your bladder often and completely. Do not hold urine for long periods of time. Empty your bladder after sex. Wipe from front to back after urinating or having a bowel movement if you are female. Use each tissue only one time when you wipe. Drink enough fluid to keep your urine pale  yellow. Keep all follow-up visits. This is important. Contact a health care provider if: Your symptoms do not get better after 1-2 days. Your symptoms go away and then return. Get help right away if: You have severe pain in your back or your lower abdomen. You have a fever or chills. You have nausea or vomiting. Summary A urinary tract infection (UTI) is an infection of any part of the urinary tract, which includes the kidneys, ureters, bladder, and urethra. Most urinary tract infections are caused by bacteria in your genital area. Treatment for this condition often includes antibiotic medicines. If you were prescribed an antibiotic medicine, take it as told by your health care provider. Do not stop using the antibiotic even if you start to feel better. Keep all follow-up visits. This is important. This information is not intended to replace advice given to you by your health care provider. Make sure you discuss any questions you have with your health care provider. Document Revised: 11/16/2019 Document Reviewed: 11/16/2019 Elsevier Patient Education  2023 Elsevier Inc.    If you have been instructed to have an in-person evaluation today at a local Urgent Care facility, please use the link below. It will take you to a list of all of our available [facility], including address, phone number and hours of operation. Please do not delay care.  [facility]  If you or a family member do not have a primary care provider, use the link below to schedule a visit and establish care. When you choose a [facility] primary care physician or advanced practice provider, you gain a long-term partner in health. Find a Primary Care Provider  Learn more about [facility]'s in-office and virtual care options: [facility]

## 2022-10-12 DIAGNOSIS — Z0000 Encounter for general adult medical examination without abnormal findings: Secondary | ICD-10-CM | POA: Diagnosis not present

## 2022-10-12 DIAGNOSIS — Z23 Encounter for immunization: Secondary | ICD-10-CM | POA: Diagnosis not present

## 2022-10-14 DIAGNOSIS — Z0000 Encounter for general adult medical examination without abnormal findings: Secondary | ICD-10-CM | POA: Diagnosis not present

## 2022-12-23 ENCOUNTER — Telehealth: Payer: BC Managed Care – PPO | Admitting: Physician Assistant

## 2022-12-23 DIAGNOSIS — R3989 Other symptoms and signs involving the genitourinary system: Secondary | ICD-10-CM

## 2022-12-23 MED ORDER — SULFAMETHOXAZOLE-TRIMETHOPRIM 800-160 MG PO TABS
1.0000 | ORAL_TABLET | Freq: Two times a day (BID) | ORAL | 0 refills | Status: DC
Start: 2022-12-23 — End: 2023-11-02

## 2022-12-23 NOTE — Progress Notes (Signed)
E-Visit for Urinary Problems  We are sorry that you are not feeling well.  Here is how we plan to help!  Based on what you shared with me it looks like you most likely have a simple urinary tract infection.  A UTI (Urinary Tract Infection) is a bacterial infection of the bladder.  Most cases of urinary tract infections are simple to treat but a key part of your care is to encourage you to drink plenty of fluids and watch your symptoms carefully.  I have prescribed Bactrim DS One tablet twice a day for 5 days.  Your symptoms should gradually improve. Call us if the burning in your urine worsens, you develop worsening fever, back pain or pelvic pain or if your symptoms do not resolve after completing the antibiotic.  Urinary tract infections can be prevented by drinking plenty of water to keep your body hydrated.  Also be sure when you wipe, wipe from front to back and don't hold it in!  If possible, empty your bladder every 4 hours.  HOME CARE Drink plenty of fluids Compete the full course of the antibiotics even if the symptoms resolve Remember, when you need to go.go. Holding in your urine can increase the likelihood of getting a UTI! GET HELP RIGHT AWAY IF: You cannot urinate You get a high fever Worsening back pain occurs You see blood in your urine You feel sick to your stomach or throw up You feel like you are going to pass out  MAKE SURE YOU  Understand these instructions. Will watch your condition. Will get help right away if you are not doing well or get worse.   Thank you for choosing an e-visit.  Your e-visit answers were reviewed by a board certified advanced clinical practitioner to complete your personal care plan. Depending upon the condition, your plan could have included both over the counter or prescription medications.  Please review your pharmacy choice. Make sure the pharmacy is open so you can pick up prescription now. If there is a problem, you may contact  your provider through MyChart messaging and have the prescription routed to another pharmacy.  Your safety is important to us. If you have drug allergies check your prescription carefully.   For the next 24 hours you can use MyChart to ask questions about today's visit, request a non-urgent call back, or ask for a work or school excuse. You will get an email in the next two days asking about your experience. I hope that your e-visit has been valuable and will speed your recovery.  

## 2022-12-23 NOTE — Progress Notes (Signed)
I have spent 5 minutes in review of e-visit questionnaire, review and updating patient chart, medical decision making and response to patient.   William Cody Martin, PA-C    

## 2022-12-28 ENCOUNTER — Other Ambulatory Visit: Payer: Self-pay | Admitting: Surgery

## 2022-12-28 DIAGNOSIS — Z1231 Encounter for screening mammogram for malignant neoplasm of breast: Secondary | ICD-10-CM

## 2022-12-31 ENCOUNTER — Ambulatory Visit
Admission: RE | Admit: 2022-12-31 | Discharge: 2022-12-31 | Disposition: A | Discharge status: Home / Self Care | Payer: BC Managed Care – PPO | Source: Ambulatory Visit

## 2022-12-31 DIAGNOSIS — Z1231 Encounter for screening mammogram for malignant neoplasm of breast: Secondary | ICD-10-CM

## 2023-01-12 DIAGNOSIS — Z23 Encounter for immunization: Secondary | ICD-10-CM | POA: Diagnosis not present

## 2023-01-12 DIAGNOSIS — T148XXA Other injury of unspecified body region, initial encounter: Secondary | ICD-10-CM | POA: Diagnosis not present

## 2023-02-14 ENCOUNTER — Other Ambulatory Visit: Payer: Self-pay | Admitting: Medical Genetics

## 2023-02-14 DIAGNOSIS — Z006 Encounter for examination for normal comparison and control in clinical research program: Secondary | ICD-10-CM

## 2023-03-01 ENCOUNTER — Other Ambulatory Visit (HOSPITAL_COMMUNITY): Payer: BC Managed Care – PPO

## 2023-03-24 ENCOUNTER — Other Ambulatory Visit (HOSPITAL_COMMUNITY): Payer: BC Managed Care – PPO

## 2023-03-25 ENCOUNTER — Other Ambulatory Visit (HOSPITAL_COMMUNITY): Payer: BC Managed Care – PPO

## 2023-04-28 ENCOUNTER — Other Ambulatory Visit (HOSPITAL_COMMUNITY): Payer: Self-pay

## 2023-04-29 ENCOUNTER — Other Ambulatory Visit (HOSPITAL_COMMUNITY): Payer: Self-pay

## 2023-05-02 DIAGNOSIS — M25512 Pain in left shoulder: Secondary | ICD-10-CM | POA: Diagnosis not present

## 2023-05-02 DIAGNOSIS — M5412 Radiculopathy, cervical region: Secondary | ICD-10-CM | POA: Diagnosis not present

## 2023-05-04 DIAGNOSIS — S46012A Strain of muscle(s) and tendon(s) of the rotator cuff of left shoulder, initial encounter: Secondary | ICD-10-CM | POA: Diagnosis not present

## 2023-05-04 DIAGNOSIS — R200 Anesthesia of skin: Secondary | ICD-10-CM | POA: Diagnosis not present

## 2023-05-04 DIAGNOSIS — M50322 Other cervical disc degeneration at C5-C6 level: Secondary | ICD-10-CM | POA: Diagnosis not present

## 2023-05-26 ENCOUNTER — Other Ambulatory Visit (HOSPITAL_COMMUNITY): Payer: Self-pay | Attending: Medical Genetics

## 2023-06-13 DIAGNOSIS — Z1239 Encounter for other screening for malignant neoplasm of breast: Secondary | ICD-10-CM | POA: Diagnosis not present

## 2023-06-13 DIAGNOSIS — Z803 Family history of malignant neoplasm of breast: Secondary | ICD-10-CM | POA: Diagnosis not present

## 2023-06-22 DIAGNOSIS — N62 Hypertrophy of breast: Secondary | ICD-10-CM | POA: Diagnosis not present

## 2023-06-22 DIAGNOSIS — Z9189 Other specified personal risk factors, not elsewhere classified: Secondary | ICD-10-CM | POA: Diagnosis not present

## 2023-06-22 DIAGNOSIS — L304 Erythema intertrigo: Secondary | ICD-10-CM | POA: Diagnosis not present

## 2023-06-22 DIAGNOSIS — M542 Cervicalgia: Secondary | ICD-10-CM | POA: Diagnosis not present

## 2023-07-01 ENCOUNTER — Other Ambulatory Visit: Payer: Self-pay | Admitting: Obstetrics and Gynecology

## 2023-07-01 DIAGNOSIS — Z1231 Encounter for screening mammogram for malignant neoplasm of breast: Secondary | ICD-10-CM

## 2023-07-26 DIAGNOSIS — Z1330 Encounter for screening examination for mental health and behavioral disorders, unspecified: Secondary | ICD-10-CM | POA: Diagnosis not present

## 2023-07-26 DIAGNOSIS — Z01419 Encounter for gynecological examination (general) (routine) without abnormal findings: Secondary | ICD-10-CM | POA: Diagnosis not present

## 2023-08-30 DIAGNOSIS — R42 Dizziness and giddiness: Secondary | ICD-10-CM | POA: Diagnosis not present

## 2023-08-30 DIAGNOSIS — I959 Hypotension, unspecified: Secondary | ICD-10-CM | POA: Diagnosis not present

## 2023-08-30 DIAGNOSIS — R55 Syncope and collapse: Secondary | ICD-10-CM | POA: Diagnosis not present

## 2023-08-31 DIAGNOSIS — R55 Syncope and collapse: Secondary | ICD-10-CM | POA: Diagnosis not present

## 2023-10-18 HISTORY — PX: REDUCTION MAMMAPLASTY: SUR839

## 2023-11-02 DIAGNOSIS — F419 Anxiety disorder, unspecified: Secondary | ICD-10-CM | POA: Diagnosis not present

## 2023-11-02 DIAGNOSIS — F32A Depression, unspecified: Secondary | ICD-10-CM | POA: Diagnosis not present

## 2023-11-03 NOTE — Progress Notes (Signed)
 Subjective Patient ID: Patricia Franco is a 43 y.o. female.   HPI  Returns for follow up discussion breast reduction. Current D cup. Reports over year history recurrent rashes beneath breasts, neck and back pain, feeling of being pulled forward. This latter intensified due to her work and having to lean forward. Reports associated headaches. With regard to rashes, patient has tried hygiene measures, topical powders, baking soda. With regards to pain patient has tried OTC pain medication, PT course, specialty fitted bras, massage therapy, weight loss as below, for over 6 month trial without relief.  Wt- lost 40 lbs over last year in part through use semaglutide, reports has remained stable weight off of glutide medication for months. Prior to weight loss DDD.  Patient is followed for high risk breast cancer. Calculated risk per referral >20%. Patient on tamoxifen.  MMG 12/2022 MRI 11/2021. 2024 high risk screening MRI denied by insurance. Per chart review plan to use CEM as part of yearly screening.   Multiple family members including mother, sisters, brothers with malignant hyperthermia. Patient herself treated with dantrolene during dental procedure. Patient herself has not been tested but sister positive on testing.  MA with breast and ovarian ca. Patient's genetic testing negative.   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  Review of Systems  Musculoskeletal:  Positive for arthralgias, myalgias and neck pain.  Allergic/Immunologic: Positive for environmental allergies.  Neurological:  Positive for headaches.  Psychiatric/Behavioral:  The patient is nervous/anxious.   All other systems reviewed and are negative.   Objective Physical Exam  Cardiovascular: Normal rate, regular rhythm and normal heart sounds.   Pulmonary/Chest Effort normal and breath sounds normal.   Skin  Fitzpatrick 2    Lymph: no palpable axillary  adenopathy  +shoulder grooving Right breast tattoo Breasts: no palpable masses, grade 3 ptosis bilateral SN to nipple R 27.5 L 28 cm BW R 18 L 18 cm Nipple to IMF R 11 L 11 cm   Assessment/Plan  Macromastia At high risk for breast cancer Chronic neck and back pain Intertrigo At risk malignant hyperthermia   Hold tamoxifen until after surgery.   Chronic neck and back and recurrent intertrigo in setting of macromastia that has failed conservative management over 3 month trial. The pain is not related to other diagnoses.There is a reasonable likelihood that the patient's symptoms are primarily due to macromastia. Breast reduction is likely to result in improvement of the chronic pain.   Reviewed reduction with anchor type scars, OP surgery, drains, post operative visits and limitations, recovery. Diminished sensation nipple and breast skin, risk of nipple loss, wound healing problems, asymmetry, incidental carcinoma, changes with wt gain/loss, aging, unacceptable cosmetic appearance reviewed. Reviewed scar maturation over months. Cannot assure her cup size.   Additional risks including but not limited to bleeding, seroma, hematoma, damage to adjacent structures, infection, asymmetry, damage to adjacent structures, need for additional procedures, unacceptable cosmetic result, blood clots in legs or lungs reviewed. Completed ASPS consent for breast reduction.   Drain teaching completed. Rx for tramadol given.   Anticipate 491 g resection from each breast.

## 2023-11-07 DIAGNOSIS — N62 Hypertrophy of breast: Secondary | ICD-10-CM | POA: Diagnosis not present

## 2023-11-07 NOTE — H&P (Signed)
 Subjective Patient ID: Patricia Franco is a 43 y.o. female.     HPI   Returns for follow up discussion breast reduction. Current D cup. Reports over year history recurrent rashes beneath breasts, neck and back pain, feeling of being pulled forward. This latter intensified due to her work and having to lean forward. Reports associated headaches. With regard to rashes, patient has tried hygiene measures, topical powders, baking soda. With regards to pain patient has tried OTC pain medication, PT course, specialty fitted bras, massage therapy, weight loss as below, for over 6 month trial without relief.   Wt- lost 40 lbs over last year in part through use semaglutide, reports has remained stable weight off of glutide medication for months. Prior to weight loss DDD.   Patient is followed for high risk breast cancer. Calculated risk per referral >20%. Patient on tamoxifen.   MMG 12/2022 MRI 11/2021. 2024 high risk screening MRI denied by insurance. Per chart review plan to use CEM as part of yearly screening.    Multiple family members including mother, sisters, brothers with malignant hyperthermia. Patient herself treated with dantrolene during dental procedure. Patient herself has not been tested but sister positive on testing.   MA with breast and ovarian ca. Patient's genetic testing negative.    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   Review of Systems  Musculoskeletal:  Positive for arthralgias, myalgias and neck pain.  Allergic/Immunologic: Positive for environmental allergies.  Neurological:  Positive for headaches.  Psychiatric/Behavioral:  The patient is nervous/anxious.   All other systems reviewed and are negative.     Objective Physical Exam  Cardiovascular: Normal rate, regular rhythm and normal heart sounds.    Pulmonary/Chest Effort normal and breath sounds normal.    Skin   Fitzpatrick 2      Lymph: no palpable  axillary adenopathy   +shoulder grooving Right breast tattoo Breasts: no palpable masses, grade 3 ptosis bilateral SN to nipple R 27.5 L 28 cm BW R 18 L 18 cm Nipple to IMF R 11 L 11 cm     Assessment/Plan Macromastia At high risk for breast cancer Chronic neck and back pain Intertrigo At risk malignant hyperthermia     Hold tamoxifen until after surgery.    Chronic neck and back and recurrent intertrigo in setting of macromastia that has failed conservative management over 3 month trial. The pain is not related to other diagnoses.There is a reasonable likelihood that the patient's symptoms are primarily due to macromastia. Breast reduction is likely to result in improvement of the chronic pain.   Reviewed reduction with anchor type scars, OP surgery, drains, post operative visits and limitations, recovery. Diminished sensation nipple and breast skin, risk of nipple loss, wound healing problems, asymmetry, incidental carcinoma, changes with wt gain/loss, aging, unacceptable cosmetic appearance reviewed. Reviewed scar maturation over months. Cannot assure her cup size.    Additional risks including but not limited to bleeding, seroma, hematoma, damage to adjacent structures, infection, asymmetry, damage to adjacent structures, need for additional procedures, unacceptable cosmetic result, blood clots in legs or lungs reviewed. Completed ASPS consent for breast reduction.   Drain teaching completed. Rx for tramadol given.   Anticipate 491 g resection from each breast.  Earlis Ranks, MD [facility]  Office/ physician access line after hours [phone]

## 2023-11-07 NOTE — Pre-Procedure Instructions (Signed)
 Surgical Instructions   Your procedure is scheduled on November 14, 2023. Report to [facility] Main Entrance A at 5:30 A.M., then check in with the Admitting office. Any questions or running late day of surgery: call [phone]  Questions prior to your surgery date: call [phone], Monday-Friday, 8am-4pm. If you experience any cold or flu symptoms such as cough, fever, chills, shortness of breath, etc. between now and your scheduled surgery, please notify us  at the above number.     Remember:  Do not eat after midnight the night before your surgery   You may drink clear liquids until 4:30 AM the morning of your surgery.   Clear liquids allowed are: Water, Non-Citrus Juices (without pulp), Carbonated Beverages, Clear Tea (no milk, honey, etc.), Black Coffee Only (NO MILK, CREAM OR POWDERED CREAMER of any kind), and Gatorade.    Take these medicines the morning of surgery with A SIP OF WATER: escitalopram (LEXAPRO)  tamoxifen (NOLVADEX)    May take these medicines IF NEEDED: acetaminophen  (TYLENOL )  EPINEPHrine  Pen   One week prior to surgery, STOP taking any Aspirin (unless otherwise instructed by your surgeon) Aleve, Naproxen, Ibuprofen , Motrin , Advil , Goody's, BC's, all herbal medications, fish oil, and non-prescription vitamins.                     Do NOT Smoke (Tobacco/Vaping) for 24 hours prior to your procedure.  If you use a CPAP at night, you may bring your mask/headgear for your overnight stay.   You will be asked to remove any contacts, glasses, piercing's, hearing aid's, dentures/partials prior to surgery. Please bring cases for these items if needed.    Patients discharged the day of surgery will not be allowed to drive home, and someone needs to stay with them for 24 hours.  SURGICAL WAITING ROOM VISITATION Patients may have no more than 2 support people in the waiting area - these visitors may rotate.   Pre-op nurse will coordinate an appropriate time for 1  ADULT support person, who may not rotate, to accompany patient in pre-op.  Children under the age of 74 must have an adult with them who is not the patient and must remain in the main waiting area with an adult.  If the patient needs to stay at the hospital during part of their recovery, the visitor guidelines for inpatient rooms apply.  Please refer to the [facility] website for the visitor guidelines for any additional information.   If you received a COVID test during your pre-op visit  it is requested that you wear a mask when out in public, stay away from anyone that may not be feeling well and notify your surgeon if you develop symptoms. If you have been in contact with anyone that has tested positive in the last 10 days please notify you surgeon.      Pre-operative CHG Bathing Instructions   You can play a key role in reducing the risk of infection after surgery. Your skin needs to be as free of germs as possible. You can reduce the number of germs on your skin by washing with CHG (chlorhexidine gluconate) soap before surgery. CHG is an antiseptic soap that kills germs and continues to kill germs even after washing.   DO NOT use if you have an allergy to chlorhexidine/CHG or antibacterial soaps. If your skin becomes reddened or irritated, stop using the CHG and notify one of our RNs at [phone].  TAKE A SHOWER THE NIGHT BEFORE SURGERY AND THE DAY OF SURGERY    Please keep in mind the following:  DO NOT shave, including legs and underarms, 48 hours prior to surgery.   You may shave your face before/day of surgery.  Place clean sheets on your bed the night before surgery Use a clean washcloth (not used since being washed) for each shower. DO NOT sleep with pet's night before surgery.  CHG Shower Instructions:  Wash your face and private area with normal soap. If you choose to wash your hair, wash first with your normal shampoo.  After you use shampoo/soap, rinse  your hair and body thoroughly to remove shampoo/soap residue.  Turn the water OFF and apply half the bottle of CHG soap to a CLEAN washcloth.  Apply CHG soap ONLY FROM YOUR NECK DOWN TO YOUR TOES (washing for 3-5 minutes)  DO NOT use CHG soap on face, private areas, open wounds, or sores.  Pay special attention to the area where your surgery is being performed.  If you are having back surgery, having someone wash your back for you may be helpful. Wait 2 minutes after CHG soap is applied, then you may rinse off the CHG soap.  Pat dry with a clean towel  Put on clean pajamas    Additional instructions for the day of surgery: DO NOT APPLY any lotions, deodorants, cologne, or perfumes.   Do not wear jewelry or makeup Do not wear nail polish, gel polish, artificial nails, or any other type of covering on natural nails (fingers and toes) Do not bring valuables to the hospital. [facility] is not responsible for valuables/personal belongings. Put on clean/comfortable clothes.  Please brush your teeth.  Ask your nurse before applying any prescription medications to the skin.

## 2023-11-08 ENCOUNTER — Encounter (HOSPITAL_COMMUNITY): Payer: Self-pay

## 2023-11-08 ENCOUNTER — Encounter (HOSPITAL_COMMUNITY)
Admission: RE | Admit: 2023-11-08 | Discharge: 2023-11-08 | Disposition: A | Discharge status: Home / Self Care | Source: Ambulatory Visit | Attending: Plastic Surgery | Admitting: Plastic Surgery

## 2023-11-08 ENCOUNTER — Other Ambulatory Visit: Payer: Self-pay

## 2023-11-08 VITALS — BP 112/71 | HR 80 | Temp 98.3°F | Resp 16 | Ht 67.0 in | Wt 167.0 lb

## 2023-11-08 DIAGNOSIS — Z803 Family history of malignant neoplasm of breast: Secondary | ICD-10-CM | POA: Diagnosis not present

## 2023-11-08 DIAGNOSIS — L304 Erythema intertrigo: Secondary | ICD-10-CM | POA: Diagnosis not present

## 2023-11-08 DIAGNOSIS — R011 Cardiac murmur, unspecified: Secondary | ICD-10-CM | POA: Diagnosis not present

## 2023-11-08 DIAGNOSIS — G8929 Other chronic pain: Secondary | ICD-10-CM | POA: Insufficient documentation

## 2023-11-08 DIAGNOSIS — N62 Hypertrophy of breast: Secondary | ICD-10-CM | POA: Diagnosis not present

## 2023-11-08 DIAGNOSIS — G43909 Migraine, unspecified, not intractable, without status migrainosus: Secondary | ICD-10-CM | POA: Insufficient documentation

## 2023-11-08 DIAGNOSIS — F32A Depression, unspecified: Secondary | ICD-10-CM | POA: Diagnosis not present

## 2023-11-08 DIAGNOSIS — Z01812 Encounter for preprocedural laboratory examination: Secondary | ICD-10-CM | POA: Insufficient documentation

## 2023-11-08 DIAGNOSIS — M542 Cervicalgia: Secondary | ICD-10-CM | POA: Diagnosis not present

## 2023-11-08 DIAGNOSIS — M5489 Other dorsalgia: Secondary | ICD-10-CM | POA: Insufficient documentation

## 2023-11-08 DIAGNOSIS — F419 Anxiety disorder, unspecified: Secondary | ICD-10-CM | POA: Diagnosis not present

## 2023-11-08 DIAGNOSIS — Z9189 Other specified personal risk factors, not elsewhere classified: Secondary | ICD-10-CM | POA: Diagnosis not present

## 2023-11-08 DIAGNOSIS — Z01818 Encounter for other preprocedural examination: Secondary | ICD-10-CM

## 2023-11-08 HISTORY — DX: Personal history of urinary calculi: Z87.442

## 2023-11-08 HISTORY — DX: Anxiety disorder, unspecified: F41.9

## 2023-11-08 HISTORY — DX: Depression, unspecified: F32.A

## 2023-11-08 LAB — BASIC METABOLIC PANEL WITH GFR
Anion gap: 8 (ref 5–15)
BUN: 11 mg/dL (ref 6–20)
CO2: 24 mmol/L (ref 22–32)
Calcium: 8.7 mg/dL — ABNORMAL LOW (ref 8.9–10.3)
Chloride: 105 mmol/L (ref 98–111)
Creatinine, Ser: 0.88 mg/dL (ref 0.44–1.00)
GFR, Estimated: >60 mL/min (ref >60)
Glucose, Bld: 87 mg/dL (ref 70–99)
Potassium: 3.8 mmol/L (ref 3.5–5.1)
Sodium: 137 mmol/L (ref 135–145)

## 2023-11-08 LAB — CBC
HCT: 35.4 % — ABNORMAL LOW (ref 36.0–46.0)
Hemoglobin: 11.7 g/dL — ABNORMAL LOW (ref 12.0–15.0)
MCH: 31.4 pg (ref 26.0–34.0)
MCHC: 33.1 g/dL (ref 30.0–36.0)
MCV: 94.9 fL (ref 80.0–100.0)
Platelets: 263 K/uL (ref 150–400)
RBC: 3.73 MIL/uL — ABNORMAL LOW (ref 3.87–5.11)
RDW: 12.6 % (ref 11.5–15.5)
WBC: 7.2 K/uL (ref 4.0–10.5)
nRBC: 0 % (ref 0.0–0.2)

## 2023-11-08 NOTE — Progress Notes (Signed)
 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  History includes never smoker, infantile murmur, migraines, anxiety, depression.  Reported history of malignant hyperthermia in multiple family members (see below).   She reported family history of malignant hyperthermia, but denied formal muscle biopsy testing. They were not initially aware of her history, but her grandmother died during a cancer surgery, and she had an uncle who died at 80 believed to be complications of MH. She said her mother also had a reaction with c-section for twins girls. 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 Will attempt to get old anesthesia records.   She had an urgent care visit on 08/30/23 for near syncope/syncope. She reported that at the time she was on Weight Watchers for about a years (12 lb weight loss in 4 months) and was restricting her calories to probably < 900/days. 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 Episode lasted only seconds. BP was 98/52 immediately after episode. She had some ongoing positional dizziness, and did not want to drive so she attempted a telemedicine visit but was  told to get transportation to the urgent care for further in-person evaluation. Labs there showed HGB 12.2, TSH 1.346, glucose 95, Cr 0.72, AST 13, ALT 16, K 4.2, Cortisol 6.6. EKG was normal. Thought to have vasovagal syncope. She was offered referrals, but she declined. She attributed episode of orthostatic hypotension related to hypovolemia, weight loss, and restricted calorie diet. She denied prior syncope outside of her restricted diet, and and since gone back to eating 3 meals per day, has gained 10 lbs back, but has had no further syncope or presyncope. 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 She is also able to walk for 45 minutes while her daughter is in horse riding lessons. She denied any CV symptoms. She said she had a murmur at birth that resolved within the first year of life and never had an echo. She denied any activity restrictions as a child or adult.   Since then she had televisit on 11/02/23 with Devonne Rush, MD for worsening anxiety, exacerbated by upcoming mammoplasty reduction surgery. Escitalopram 20 mg daily since 2015 has been effectively managing her anxiety up until recently. Buspirone 7.5 mg BID, with option to increase to TID if needed.  Near syncope/syncope episode in May appears to be related to orthostasis in setting of  significant restricted calorie diet with weight loss/hypovolemia. SBP in the 90's at the time. As above,  no recurrence after improved intake. She is able to exercise > 4 METS without symptoms. Heart RRR, no murmur heard on exam. Lung clear. No carotid bruits. No pitting edema.   As above, family history of MH and reported history of trigger free induction. Anesthesia team to evaluate on the day of surgery.    VS: BP 112/71   Pulse 80   Temp 36.8 C   Resp 16   Ht 5' 7 (1.702 m)   Wt 75.8 kg   SpO2 100%   BMI 26.16 kg/m   PROVIDERS: Jolee Madelin Patch, MD  is PCP. Also see Joshua Pay, FNP there as well. ([facility])   LABS: Labs reviewed: Acceptable for surgery. (all labs ordered are listed, but only abnormal results are displayed)  Labs Reviewed  BASIC METABOLIC PANEL WITH GFR - Abnormal; Notable for the following components:      Result Value   Calcium 8.7 (*)    All other components within normal limits  CBC - Abnormal; Notable for the following components:   RBC 3.73 (*)    Hemoglobin 11.7 (*)    HCT 35.4 (*)    All other components within normal limits    IMAGES: Xray C-spine 05/04/23 ([facility]): Mild degenerative disc disease at C5-6 and C6-7.    EKG:  EKG 08/30/23 ([facility]): Tracing requested. Per Result Narrative in [facility] Sinus rhythm  Normal ECG  No previous ECGs available  Confirmed by Launie Stanford  [id number]  on 08-31-2023 8 00 13 AM    CV: N/A  Past Medical History:  Diagnosis Date   Anxiety    Depression    Family history of breast cancer    Family history of colon cancer    Family history of lung cancer    Family history of ovarian cancer    Family history of thyroid cancer    Heart murmur    History of kidney stones    Malignant hyperthermia    pt's mother   Malignant hyperthermia    Migraine headache with aura 10/06/2013   Migraines    Miscarriage 2012, 2013   Postpartum care following vaginal delivery (9/21) 01/07/2014    Past Surgical History:  Procedure Laterality Date   BREAST BIOPSY Left    BREAST BIOPSY Right    BREAST EXCISIONAL BIOPSY Left    BREAST EXCISIONAL BIOPSY Right    MOUTH SURGERY      MEDICATIONS:  busPIRone (BUSPAR) 7.5 MG tablet   acetaminophen  (TYLENOL ) 325 MG tablet   EPINEPHrine  (EPIPEN  2-PAK) 0.3 mg/0.3 mL IJ SOAJ injection   escitalopram (LEXAPRO) 20 MG tablet   ibuprofen  (ADVIL ) 200 MG tablet   levonorgestrel  (MIRENA ) 20 MCG/DAY IUD   tamoxifen (NOLVADEX) 20 MG tablet   No current facility-administered medications for this encounter.

## 2023-11-08 NOTE — Progress Notes (Signed)
 PCP -Santana Tarry Molt Cardiologist - denies  PPM/ICD - denies   Chest x-ray - denies EKG - 08/30/23 - requested  Stress Test - denies ECHO - denies Cardiac Cath - denies  Sleep Study - denies CPAP -  n/a  No DM  Last dose of GLP1 agonist-  n/a GLP1 instructions:  n/a  Blood Thinner Instructions: n/a Aspirin Instructions: n/a  ERAS Protcol - clears until 0430 PRE-SURGERY Ensure or G2- n/a  COVID TEST- n/a   Anesthesia review: yes- patient was seen by Isaiah at PAT appointment- MH and recent syncopal episodes   -patient states that since she changed her diet and increased her hydration she has not had any issues - she states that at that time she was on a weight loss program  Patient denies shortness of breath, fever, cough and chest pain at PAT appointment   All instructions explained to the patient, with a verbal understanding of the material. Patient agrees to go over the instructions while at home for a better understanding. Patient also instructed to self quarantine after being tested for COVID-19. The opportunity to ask questions was provided.

## 2023-11-09 NOTE — Anesthesia Preprocedure Evaluation (Addendum)
 Anesthesia Evaluation  Patient identified by MRN, date of birth, ID band Patient awake    Reviewed: Allergy & Precautions, NPO status , Patient's Chart, lab work & pertinent test results  History of Anesthesia Complications (+) MALIGNANT HYPERTHERMIA and history of anesthetic complications  Airway Mallampati: III  TM Distance: >3 FB Neck ROM: Full    Dental  (+) Edentulous Upper, Edentulous Lower, Upper Dentures, Lower Dentures, Dental Advisory Given   Pulmonary neg shortness of breath, neg sleep apnea, neg COPD, neg recent URI   breath sounds clear to auscultation       Cardiovascular (-) hypertension(-) angina (-) Past MI negative cardio ROS  Rhythm:Regular     Neuro/Psych  Headaches PSYCHIATRIC DISORDERS Anxiety Depression       GI/Hepatic negative GI ROS, Neg liver ROS,,,  Endo/Other  negative endocrine ROS    Renal/GU negative Renal ROS     Musculoskeletal negative musculoskeletal ROS (+)    Abdominal   Peds  Hematology negative hematology ROS (+) Lab Results      Component                Value               Date                      WBC                      7.2                 11/08/2023                HGB                      11.7 (L)            11/08/2023                HCT                      35.4 (L)            11/08/2023                MCV                      94.9                11/08/2023                PLT                      263                 11/08/2023              Anesthesia Other Findings   Reproductive/Obstetrics                              Anesthesia Physical Anesthesia Plan  ASA: 2  Anesthesia Plan: General   Post-op Pain Management: Tylenol  PO (pre-op)* and Celebrex  PO (pre-op)*   Induction: Intravenous  PONV Risk Score and Plan: 4 or greater and Ondansetron , Dexamethasone , Propofol  infusion and TIVA  Airway Management Planned: Oral ETT  Additional  Equipment:   Intra-op Plan:   Post-operative Plan: Extubation in OR  Informed Consent: I  have reviewed the patients History and Physical, chart, labs and discussed the procedure including the risks, benefits and alternatives for the proposed anesthesia with the patient or authorized representative who has indicated his/her understanding and acceptance.     Dental advisory given  Plan Discussed with: CRNA  Anesthesia Plan Comments: (See PAT note written 11/09/2023 by Allison Zelenak, PA-C.  Strong family history of malignant hyperthermia (with related perioperative deaths attributed to MH, denied muscle biopsy). Patient with trigger free anesthesia in 2022 given history. )         Anesthesia Quick Evaluation

## 2023-11-14 ENCOUNTER — Encounter (HOSPITAL_COMMUNITY)
Admission: RE | Disposition: A | Discharge status: Home / Self Care | Payer: Self-pay | Source: Home / Self Care | Attending: Plastic Surgery

## 2023-11-14 ENCOUNTER — Ambulatory Visit (HOSPITAL_COMMUNITY): Payer: Self-pay | Admitting: Vascular Surgery

## 2023-11-14 ENCOUNTER — Other Ambulatory Visit: Payer: Self-pay

## 2023-11-14 ENCOUNTER — Ambulatory Visit (HOSPITAL_COMMUNITY): Payer: Self-pay | Admitting: Certified Registered Nurse Anesthetist

## 2023-11-14 ENCOUNTER — Ambulatory Visit (HOSPITAL_COMMUNITY)
Admission: RE | Admit: 2023-11-14 | Discharge: 2023-11-14 | Disposition: A | Discharge status: Home / Self Care | Attending: Plastic Surgery | Admitting: Plastic Surgery

## 2023-11-14 ENCOUNTER — Encounter (HOSPITAL_COMMUNITY): Payer: Self-pay | Admitting: Plastic Surgery

## 2023-11-14 DIAGNOSIS — N62 Hypertrophy of breast: Secondary | ICD-10-CM | POA: Insufficient documentation

## 2023-11-14 DIAGNOSIS — M542 Cervicalgia: Secondary | ICD-10-CM | POA: Insufficient documentation

## 2023-11-14 DIAGNOSIS — Z803 Family history of malignant neoplasm of breast: Secondary | ICD-10-CM | POA: Diagnosis not present

## 2023-11-14 DIAGNOSIS — L304 Erythema intertrigo: Secondary | ICD-10-CM | POA: Diagnosis not present

## 2023-11-14 DIAGNOSIS — M549 Dorsalgia, unspecified: Secondary | ICD-10-CM | POA: Insufficient documentation

## 2023-11-14 DIAGNOSIS — Z01818 Encounter for other preprocedural examination: Secondary | ICD-10-CM

## 2023-11-14 DIAGNOSIS — G8929 Other chronic pain: Secondary | ICD-10-CM | POA: Diagnosis not present

## 2023-11-14 DIAGNOSIS — M5489 Other dorsalgia: Secondary | ICD-10-CM | POA: Diagnosis not present

## 2023-11-14 HISTORY — PX: BREAST REDUCTION SURGERY: SHX8

## 2023-11-14 LAB — POCT PREGNANCY, URINE: Preg Test, Ur: NEGATIVE

## 2023-11-14 SURGERY — MAMMOPLASTY, REDUCTION
Anesthesia: General | Site: Breast | Laterality: Bilateral

## 2023-11-14 MED ORDER — ACETAMINOPHEN 10 MG/ML IV SOLN: 1000.0000 mg | Freq: Once | INTRAVENOUS | Status: DC | PRN

## 2023-11-14 MED ORDER — PROPOFOL 10 MG/ML IV BOLUS
INTRAVENOUS | Status: AC
  Filled 2023-11-14: qty 20

## 2023-11-14 MED ORDER — ONDANSETRON HCL 4 MG/2ML IJ SOLN
INTRAMUSCULAR | Status: DC | PRN
  Administered 2023-11-14: 4 mg via INTRAVENOUS

## 2023-11-14 MED ORDER — ROCURONIUM BROMIDE 10 MG/ML (PF) SYRINGE
PREFILLED_SYRINGE | INTRAVENOUS | Status: DC | PRN
  Administered 2023-11-14: 20 mg via INTRAVENOUS
  Administered 2023-11-14: 80 mg via INTRAVENOUS

## 2023-11-14 MED ORDER — ORAL CARE MOUTH RINSE: 15.0000 mL | Freq: Once | OROMUCOSAL | Status: AC

## 2023-11-14 MED ORDER — MIDAZOLAM HCL 2 MG/2ML IJ SOLN
INTRAMUSCULAR | Status: AC
  Filled 2023-11-14: qty 2

## 2023-11-14 MED ORDER — LACTATED RINGERS IV SOLN: INTRAVENOUS | Status: DC

## 2023-11-14 MED ORDER — CIPROFLOXACIN IN D5W 400 MG/200ML IV SOLN
400.0000 mg | INTRAVENOUS | Status: AC
  Administered 2023-11-14: 400 mg via INTRAVENOUS
  Filled 2023-11-14: qty 200

## 2023-11-14 MED ORDER — LACTATED RINGERS IV SOLN: INTRAVENOUS | Status: DC | PRN

## 2023-11-14 MED ORDER — HYDROMORPHONE HCL 1 MG/ML IJ SOLN
INTRAMUSCULAR | Status: AC
  Filled 2023-11-14: qty 0.5

## 2023-11-14 MED ORDER — CELECOXIB 200 MG PO CAPS
200.0000 mg | ORAL_CAPSULE | ORAL | Status: AC
  Administered 2023-11-14: 200 mg via ORAL
  Filled 2023-11-14: qty 1

## 2023-11-14 MED ORDER — CHLORHEXIDINE GLUCONATE CLOTH 2 % EX PADS: 6.0000 | MEDICATED_PAD | Freq: Once | CUTANEOUS | Status: DC

## 2023-11-14 MED ORDER — ACETAMINOPHEN 500 MG PO TABS
1000.0000 mg | ORAL_TABLET | ORAL | Status: AC
  Administered 2023-11-14: 1000 mg via ORAL
  Filled 2023-11-14: qty 2

## 2023-11-14 MED ORDER — OXYCODONE HCL 5 MG/5ML PO SOLN: 5.0000 mg | Freq: Once | ORAL | Status: DC | PRN

## 2023-11-14 MED ORDER — PHENYLEPHRINE 80 MCG/ML (10ML) SYRINGE FOR IV PUSH (FOR BLOOD PRESSURE SUPPORT)
PREFILLED_SYRINGE | INTRAVENOUS | Status: DC | PRN
  Administered 2023-11-14: 160 ug via INTRAVENOUS

## 2023-11-14 MED ORDER — FENTANYL CITRATE (PF) 100 MCG/2ML IJ SOLN
INTRAMUSCULAR | Status: AC
  Filled 2023-11-14: qty 2

## 2023-11-14 MED ORDER — ONDANSETRON HCL 4 MG/2ML IJ SOLN: 4.0000 mg | Freq: Once | INTRAMUSCULAR | Status: DC

## 2023-11-14 MED ORDER — CHLORHEXIDINE GLUCONATE 0.12 % MT SOLN
15.0000 mL | Freq: Once | OROMUCOSAL | Status: AC
  Administered 2023-11-14: 15 mL via OROMUCOSAL
  Filled 2023-11-14: qty 15

## 2023-11-14 MED ORDER — FENTANYL CITRATE (PF) 250 MCG/5ML IJ SOLN
INTRAMUSCULAR | Status: DC | PRN
  Administered 2023-11-14: 50 ug via INTRAVENOUS
  Administered 2023-11-14: 100 ug via INTRAVENOUS
  Administered 2023-11-14 (×2): 50 ug via INTRAVENOUS

## 2023-11-14 MED ORDER — BUPIVACAINE HCL (PF) 0.5 % IJ SOLN
INTRAMUSCULAR | Status: DC | PRN
  Administered 2023-11-14 (×2): 15 mL

## 2023-11-14 MED ORDER — 0.9 % SODIUM CHLORIDE (POUR BTL) OPTIME
TOPICAL | Status: DC | PRN
  Administered 2023-11-14: 1000 mL

## 2023-11-14 MED ORDER — PROPOFOL 500 MG/50ML IV EMUL
INTRAVENOUS | Status: DC | PRN
  Administered 2023-11-14: 150 ug/kg/min via INTRAVENOUS

## 2023-11-14 MED ORDER — ONDANSETRON HCL 4 MG/2ML IJ SOLN
INTRAMUSCULAR | Status: AC
Start: 2023-11-14 — End: 2023-11-14
  Filled 2023-11-14: qty 2

## 2023-11-14 MED ORDER — LIDOCAINE 2% (20 MG/ML) 5 ML SYRINGE
INTRAMUSCULAR | Status: DC | PRN
  Administered 2023-11-14: 60 mg via INTRAVENOUS

## 2023-11-14 MED ORDER — FENTANYL CITRATE (PF) 100 MCG/2ML IJ SOLN
25.0000 ug | INTRAMUSCULAR | Status: DC | PRN
  Administered 2023-11-14 (×2): 25 ug via INTRAVENOUS

## 2023-11-14 MED ORDER — CEFAZOLIN SODIUM-DEXTROSE 2-4 GM/100ML-% IV SOLN: 2.0000 g | INTRAVENOUS | Status: DC

## 2023-11-14 MED ORDER — OXYCODONE HCL 5 MG PO TABS: 5.0000 mg | ORAL_TABLET | Freq: Once | ORAL | Status: DC | PRN

## 2023-11-14 MED ORDER — FENTANYL CITRATE (PF) 250 MCG/5ML IJ SOLN
INTRAMUSCULAR | Status: AC
  Filled 2023-11-14: qty 5

## 2023-11-14 MED ORDER — BUPIVACAINE HCL (PF) 0.5 % IJ SOLN
INTRAMUSCULAR | Status: AC
  Filled 2023-11-14: qty 30

## 2023-11-14 MED ORDER — MIDAZOLAM HCL 2 MG/2ML IJ SOLN
INTRAMUSCULAR | Status: DC | PRN
  Administered 2023-11-14: 2 mg via INTRAVENOUS

## 2023-11-14 MED ORDER — HYDROMORPHONE HCL 1 MG/ML IJ SOLN
INTRAMUSCULAR | Status: DC | PRN
  Administered 2023-11-14: .5 mg via INTRAVENOUS

## 2023-11-14 MED ORDER — DEXAMETHASONE SODIUM PHOSPHATE 10 MG/ML IJ SOLN
INTRAMUSCULAR | Status: DC | PRN
  Administered 2023-11-14: 10 mg via INTRAVENOUS

## 2023-11-14 MED ORDER — SUGAMMADEX SODIUM 200 MG/2ML IV SOLN
INTRAVENOUS | Status: DC | PRN
  Administered 2023-11-14: 200 mg via INTRAVENOUS

## 2023-11-14 MED ORDER — PROPOFOL 10 MG/ML IV BOLUS
INTRAVENOUS | Status: DC | PRN
  Administered 2023-11-14: 180 mg via INTRAVENOUS

## 2023-11-14 SURGICAL SUPPLY — 41 items
BAG COUNTER SPONGE SURGICOUNT (BAG) ×1 IMPLANT
BINDER BREAST LRG (GAUZE/BANDAGES/DRESSINGS) IMPLANT
BINDER BREAST MEDIUM (GAUZE/BANDAGES/DRESSINGS) IMPLANT
BINDER BREAST XLRG (GAUZE/BANDAGES/DRESSINGS) IMPLANT
BINDER BREAST XXLRG (GAUZE/BANDAGES/DRESSINGS) IMPLANT
BLADE SURG 10 STRL SS (BLADE) ×4 IMPLANT
BNDG GAUZE DERMACEA FLUFF 4 (GAUZE/BANDAGES/DRESSINGS) ×2 IMPLANT
CANISTER SUCTION 3000ML PPV (SUCTIONS) ×1 IMPLANT
CHLORAPREP W/TINT 26 (MISCELLANEOUS) ×2 IMPLANT
COVER SURGICAL LIGHT HANDLE (MISCELLANEOUS) ×1 IMPLANT
DERMABOND ADVANCED .7 DNX12 (GAUZE/BANDAGES/DRESSINGS) ×2 IMPLANT
DRAPE HALF SHEET 40X57 (DRAPES) ×2 IMPLANT
DRAPE TOP ARMCOVERS (MISCELLANEOUS) ×1 IMPLANT
DRAPE U-SHAPE 76X120 STRL (DRAPES) ×1 IMPLANT
ELECT COATED BLADE 2.86 ST (ELECTRODE) ×1 IMPLANT
ELECTRODE BLDE 4.0 EZ CLN MEGD (MISCELLANEOUS) IMPLANT
ELECTRODE REM PT RTRN 9FT ADLT (ELECTROSURGICAL) ×1 IMPLANT
EVACUATOR SILICONE 100CC (DRAIN) IMPLANT
GAUZE PAD ABD 8X10 STRL (GAUZE/BANDAGES/DRESSINGS) ×2 IMPLANT
GLOVE BIO SURGEON STRL SZ 6 (GLOVE) ×2 IMPLANT
GOWN STRL REUS W/ TWL LRG LVL3 (GOWN DISPOSABLE) ×2 IMPLANT
MARKER SKIN DUAL TIP RULER LAB (MISCELLANEOUS) IMPLANT
NDL HYPO 25GX1X1/2 BEV (NEEDLE) ×1 IMPLANT
NEEDLE HYPO 25GX1X1/2 BEV (NEEDLE) ×1 IMPLANT
NS IRRIG 1000ML POUR BTL (IV SOLUTION) ×1 IMPLANT
PACK GENERAL/GYN (CUSTOM PROCEDURE TRAY) ×1 IMPLANT
PAD ABD 8X10 STRL (GAUZE/BANDAGES/DRESSINGS) IMPLANT
PENCIL BUTTON HOLSTER BLD 10FT (ELECTRODE) ×1 IMPLANT
PIN SAFETY STERILE (MISCELLANEOUS) ×1 IMPLANT
SPIKE FLUID TRANSFER (MISCELLANEOUS) ×1 IMPLANT
SPONGE T-LAP 18X18 ~~LOC~~+RFID (SPONGE) ×2 IMPLANT
STAPLER SKIN PROX 35W (STAPLE) ×1 IMPLANT
SUT ETHILON 2 0 FS 18 (SUTURE) ×1 IMPLANT
SUT MNCRL AB 3-0 PS2 18 (SUTURE) IMPLANT
SUT MNCRL AB 4-0 PS2 18 (SUTURE) ×2 IMPLANT
SUT STRATA 3-0 60 PS-1 (SUTURE) ×2 IMPLANT
SUT VIC AB 3-0 PS1 18XBRD (SUTURE) ×4 IMPLANT
SUT VIC AB 4-0 PS2 18 (SUTURE) ×2 IMPLANT
SYR CONTROL 10ML LL (SYRINGE) ×1 IMPLANT
TOWEL GREEN STERILE (TOWEL DISPOSABLE) ×1 IMPLANT
WATER STERILE IRR 1000ML POUR (IV SOLUTION) ×1 IMPLANT

## 2023-11-14 NOTE — Anesthesia Procedure Notes (Signed)
 Procedure Name: Intubation Date/Time: 11/14/2023 7:38 AM  Performed by: Scherrie Mast, CRNAPre-anesthesia Checklist: Patient identified, Emergency Drugs available, Suction available and Patient being monitored Patient Re-evaluated:Patient Re-evaluated prior to induction Oxygen  Delivery Method: Circle System Utilized Preoxygenation: Pre-oxygenation with 100% oxygen  Induction Type: IV induction Ventilation: Mask ventilation without difficulty Laryngoscope Size: Mac and 3 Grade View: Grade I Tube type: Oral Tube size: 7.0 mm Number of attempts: 1 Airway Equipment and Method: Stylet and Oral airway Placement Confirmation: ETT inserted through vocal cords under direct vision, positive ETCO2 and breath sounds checked- equal and bilateral Secured at: 21 cm Tube secured with: Tape Dental Injury: Teeth and Oropharynx as per pre-operative assessment

## 2023-11-14 NOTE — Transfer of Care (Signed)
 Immediate Anesthesia Transfer of Care Note  Patient: Patricia Franco  Procedure(s) Performed: MAMMOPLASTY, REDUCTION (Bilateral: Breast)  Patient Location: PACU  Anesthesia Type:General  Level of Consciousness: awake, alert , and oriented  Airway & Oxygen  Therapy: Patient Spontanous Breathing and Patient connected to nasal cannula oxygen   Post-op Assessment: Report given to RN and Post -op Vital signs reviewed and stable  Post vital signs: Reviewed and stable  Last Vitals:  Vitals Value Taken Time  BP 110/73 11/14/23 10:00  Temp 36.6 C 11/14/23 09:57  Pulse 73 11/14/23 10:02  Resp 9 11/14/23 10:02  SpO2 97 % 11/14/23 10:02  Vitals shown include unfiled device data.  Last Pain:  Vitals:   11/14/23 0621  TempSrc:   PainSc: 0-No pain      Patients Stated Pain Goal: 0 (11/14/23 9388)  Complications: No notable events documented.

## 2023-11-14 NOTE — Op Note (Signed)
 Operative Note   DATE OF OPERATION: 7.28.2025  LOCATION: [facility]  SURGICAL DIVISION: Plastic Surgery  PREOPERATIVE DIAGNOSES:  1. Macromastia 2. Chronic neck and back pain 3. Intertrigo 4. High risk breast cancer  POSTOPERATIVE DIAGNOSES:  same  PROCEDURE:  Bilateral breast reduction  SURGEON: Earlis Ranks MD MBA  ASSISTANTBETHA VEAR Seats RNFA  ANESTHESIA:  General.   EBL: 100 ml  COMPLICATIONS: None immediate.   INDICATIONS FOR PROCEDURE:  The patient, Patricia Franco, is a 43 y.o. female born on 08/01/1980, is here for treatment chronic neck and back pain intertrigo in setting macromastia that has failed conservative measures   FINDINGS: Right reduction total 236 g Left reduction 185 g  DESCRIPTION OF PROCEDURE:  The patient was marked standing in the preoperative area to mark sternal notch, chest midline, anterior axillary lines, inframammary folds. The location of new nipple areolar complex was marked at level of on inframammary fold on anterior surface breast by palpation. This was marked symmetric over bilateral breasts. With aid of Wise pattern marker, location of new nipple areolar complex and vertical limbs (6 cm) were marked by displacement of breasts along meridian. The patient was taken to the operating room. SCDs were placed and IV antibiotics were given. The patient's operative site was prepped and draped in a sterile fashion. A time out was performed and all information was confirmed to be correct.     I began on left breast. Over left breast, superior medial pedicle marked and nipple areolar complex incised with 42 mm diameter marker. Pedicle deepithlialized and developed to chest wall. Pedicle developed to chest wall. Breast tissue resected over lower pole. Medial and lateral flaps developed. Additional superior and lateral breast tissue excised. Breast tailor tacked closed.   I then directed attention to right breast where superior medial pedicle  designed. NAC incised with 42 mm diameter marker. The pedicle was deepithelialized. Pedicle developed to chest wall. Breast tissue resected over lower pole. Medial and lateral flaps developed. Additional superior and lateral breast tissue excised. Breast tailor tacked closed. Patient brought to upright sitting position and assessed for symmetry. Patient returned to supine position. Breast cavities irrigated and hemostasis obtained. Local anesthetic infiltrated throughout each breast. 15 Fr JP placed in each breast and secured with 2-0 nylon. Closure completed bilateral with interrupted and short running 3-0 vicryl used to approximate dermis along remainder inframammary fold and vertical limb. NAC inset with 3-0 vicryl in dermis. Skin closure completed with 4-0 monocryl subcuticular throughout vertical limbs and NAC. Skin closure completed with 3-0 monocryl along each IMF. Tissue adhesive applied. Dry dressing and breast binder applied.  The patient was allowed to wake from anesthesia, extubated and taken to the recovery room in satisfactory condition.   SPECIMENS: right breast reduction, right breast reduction:prior biopsy with clip, left breast reduction  DRAINS: 15 Fr JP in right and left breast  Earlis Ranks, MD [facility]astic [facility]  Office/ physician access line after hours [phone]

## 2023-11-14 NOTE — Interval H&P Note (Signed)
 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  The various methods of treatment have been discussed with the patient and family. After consideration of risks, benefits and other options for treatment, the patient has consented to  Procedure(s): MAMMOPLASTY, REDUCTION (Bilateral) as a surgical intervention.  The patient's history has been reviewed, patient examined, no change in status, stable for surgery.  I have reviewed the patient's chart and labs.  Questions were answered to the patient's satisfaction.     [name]

## 2023-11-15 ENCOUNTER — Encounter (HOSPITAL_COMMUNITY): Payer: Self-pay | Admitting: Plastic Surgery

## 2023-11-15 LAB — SURGICAL PATHOLOGY

## 2023-11-15 NOTE — Anesthesia Postprocedure Evaluation (Signed)
 Anesthesia Post Note  Patient: Patricia Franco  Procedure(s) Performed: MAMMOPLASTY, REDUCTION (Bilateral: Breast)     Patient location during evaluation: PACU Anesthesia Type: General Level of consciousness: awake and patient cooperative Pain management: pain level controlled Vital Signs Assessment: post-procedure vital signs reviewed and stable Respiratory status: spontaneous breathing, nonlabored ventilation and respiratory function stable Cardiovascular status: blood pressure returned to baseline and stable Postop Assessment: no apparent nausea or vomiting Anesthetic complications: no   There were no known notable events for this encounter.                 Lowry Bala

## 2023-11-18 DIAGNOSIS — N62 Hypertrophy of breast: Secondary | ICD-10-CM | POA: Diagnosis not present

## 2023-11-22 ENCOUNTER — Other Ambulatory Visit: Payer: Self-pay | Admitting: Surgery

## 2023-11-22 DIAGNOSIS — Z1239 Encounter for other screening for malignant neoplasm of breast: Secondary | ICD-10-CM

## 2023-11-23 DIAGNOSIS — Z9889 Other specified postprocedural states: Secondary | ICD-10-CM | POA: Diagnosis not present

## 2023-12-14 DIAGNOSIS — Z9189 Other specified personal risk factors, not elsewhere classified: Secondary | ICD-10-CM | POA: Diagnosis not present

## 2023-12-14 DIAGNOSIS — Z803 Family history of malignant neoplasm of breast: Secondary | ICD-10-CM | POA: Diagnosis not present

## 2023-12-14 DIAGNOSIS — Z4889 Encounter for other specified surgical aftercare: Secondary | ICD-10-CM | POA: Diagnosis not present

## 2024-01-06 ENCOUNTER — Telehealth: Admitting: Family Medicine

## 2024-01-06 DIAGNOSIS — H60502 Unspecified acute noninfective otitis externa, left ear: Secondary | ICD-10-CM | POA: Diagnosis not present

## 2024-01-06 MED ORDER — NEOMYCIN-POLYMYXIN-HC 3.5-10000-1 OT SOLN: 3.0000 [drp] | Freq: Four times a day (QID) | OTIC | 0 refills | Status: AC

## 2024-01-06 NOTE — Progress Notes (Signed)
 E Visit for Ear Pain - Swimmer's Ear  We are sorry that you are not feeling well. Here is how we plan to help!  Based on what you have shared with me it looks like you have Swimmer's Ear.  Swimmer's ear is a redness or swelling, irritation, or infection of your outer ear canal. These symptoms usually occur within a few days of swimming. Your ear canal is a tube that goes from the opening of the ear to the eardrum.  When water stays in your ear canal, germs can grow.  This is a painful condition that often happens to children and swimmers of all ages.  It is not contagious and oral antibiotics are not required to treat uncomplicated swimmer's ear.  The usual symptoms include:    Itchiness inside the ear  Redness or a sense of swelling in the ear  Pain when the ear is tugged on when pressure is placed on the ear  Pus draining from the infected ear    I have prescribed: Neomycin  0.35%, polymyxin B 10,000 units/mL, and hydrocortisone 0,5% otic solution 4 drops in affected ears four times a day for 7 days  In certain cases, swimmer's ear may progress to a more serious bacterial infection of the middle or inner ear.  If you have a fever 102 and up and significantly worsening symptoms, this could indicate a more serious infection moving to the middle/inner and needs face to face evaluation in an office by a provider.  Your symptoms should improve over the next 3 days and should resolve in about 7 days.  Be sure to complete ALL of your prescription.  HOME CARE: Wash your hands frequently. If you are prescribed an ear drop, do not place the tip of the bottle on your ear or touch it with your fingers. You can take Acetaminophen  650 mg every 4-6 hours as needed for pain.  If pain is severe or moderate, you can apply a heating pad (set on low) or hot water bottle (wrapped in a towel) to outer ear for 20 minutes.  This will also increase drainage. Avoid ear plugs Do not go swimming until the symptoms are  gone Do not use Q-tips After showers, help the water run out by tilting your head to one side.   GET HELP RIGHT AWAY IF: Fever is over 102.2 degrees. You develop progressive ear pain or hearing loss. Ear symptoms persist longer than 3 days after treatment.  MAKE SURE YOU: Understand these instructions. Will watch your condition. Will get help right away if you are not doing well or get worse.  TO PREVENT SWIMMER'S EAR: Use a bathing cap or custom fitted swim molds to keep your ears dry. Towel off after swimming to dry your ears. Tilt your head or pull your earlobes to allow the water to escape your ear canal. If there is still water in your ears, consider using a hairdryer on the lowest setting.  Thank you for choosing an e-visit.  Your e-visit answers were reviewed by a board certified advanced clinical practitioner to complete your personal care plan. Depending upon the condition, your plan could have included both over the counter or prescription medications.  Please review your pharmacy choice. Make sure the pharmacy is open so you can pick up the prescription now. If there is a problem, you may contact your provider through [employer] and have the prescription routed to another pharmacy.  Your safety is important to us . If you have drug  allergies check your prescription carefully.   For the next 24 hours you can use MyChart to ask questions about today's visit, request a non-urgent call back, or ask for a work or school excuse. You will get an email with a survey after your eVisit asking about your experience. We would appreciate your feedback. I hope that your e-visit has been valuable and will aid in your recovery.    have provided 5 minutes of non face to face time during this encounter for chart review and documentation.

## 2024-01-28 ENCOUNTER — Other Ambulatory Visit: Payer: Self-pay | Admitting: Medical Genetics

## 2024-01-28 DIAGNOSIS — Z006 Encounter for examination for normal comparison and control in clinical research program: Secondary | ICD-10-CM

## 2024-02-09 ENCOUNTER — Other Ambulatory Visit: Payer: Self-pay | Admitting: Surgery

## 2024-02-09 DIAGNOSIS — Z1239 Encounter for other screening for malignant neoplasm of breast: Secondary | ICD-10-CM

## 2024-02-24 ENCOUNTER — Other Ambulatory Visit: Payer: Self-pay | Admitting: Surgery

## 2024-02-24 DIAGNOSIS — Z1231 Encounter for screening mammogram for malignant neoplasm of breast: Secondary | ICD-10-CM

## 2024-03-01 ENCOUNTER — Ambulatory Visit
Admission: RE | Admit: 2024-03-01 | Discharge: 2024-03-01 | Disposition: A | Source: Ambulatory Visit | Attending: Surgery | Admitting: Surgery

## 2024-03-01 ENCOUNTER — Telehealth: Admitting: Physician Assistant

## 2024-03-01 DIAGNOSIS — Z1231 Encounter for screening mammogram for malignant neoplasm of breast: Secondary | ICD-10-CM | POA: Diagnosis not present

## 2024-03-01 DIAGNOSIS — M546 Pain in thoracic spine: Secondary | ICD-10-CM

## 2024-03-01 MED ORDER — NAPROXEN 500 MG PO TABS: 500.0000 mg | ORAL_TABLET | Freq: Two times a day (BID) | ORAL | 0 refills | Status: DC

## 2024-03-01 MED ORDER — TIZANIDINE HCL 2 MG PO TABS: 2.0000 mg | ORAL_TABLET | Freq: Four times a day (QID) | ORAL | 0 refills | Status: DC | PRN

## 2024-03-01 NOTE — Progress Notes (Signed)
 We are sorry that you are not feeling well.  Here is how we plan to help!  Based on what you have shared with me it looks like you mostly have acute back pain.  Acute back pain is defined as musculoskeletal pain that can resolve in 1-3 weeks with conservative treatment.  I have prescribed Naprosyn 500 mg take one by mouth twice a day non-steroid anti-inflammatory (NSAID) as well as Tizanidine 2 mg every eight hours as needed which is a muscle relaxer  This will help with the tension and tension headache along with the back pain.  Some patients experience stomach irritation or in increased heartburn with anti-inflammatory drugs.  Please keep in mind that muscle relaxer's can cause fatigue and should not be taken while at work or driving.  Back pain is very common.  The pain often gets better over time.  The cause of back pain is usually not dangerous.  Most people can learn to manage their back pain on their own.  Home Care Stay active.  Start with short walks on flat ground if you can.  Try to walk farther each day. Do not sit, drive or stand in one place for more than 30 minutes.  Do not stay in bed. Do not avoid exercise or work.  Activity can help your back heal faster. Be careful when you bend or lift an object.  Bend at your knees, keep the object close to you, and do not twist. Sleep on a firm mattress.  Lie on your side, and bend your knees.  If you lie on your back, put a pillow under your knees. Only take medicines as told by your doctor. Put ice on the injured area. Put ice in a plastic bag Place a towel between your skin and the bag Leave the ice on for 15-20 minutes, 3-4 times a day for the first 2-3 days. 210 After that, you can switch between ice and heat packs. Ask your doctor about back exercises or massage. Avoid feeling anxious or stressed.  Find good ways to deal with stress, such as exercise.  Get Help Right Way If: Your pain does not go away with rest or medicine. Your  pain does not go away in 1 week. You have new problems. You do not feel well. The pain spreads into your legs. You cannot control when you poop (bowel movement) or pee (urinate) You feel sick to your stomach (nauseous) or throw up (vomit) You have belly (abdominal) pain. You feel like you may pass out (faint). If you develop a fever.  Make Sure you: Understand these instructions. Will watch your condition Will get help right away if you are not doing well or get worse.  Your e-visit answers were reviewed by a board certified advanced clinical practitioner to complete your personal care plan.  Depending on the condition, your plan could have included both over the counter or prescription medications.  If there is a problem please reply  once you have received a response from your provider.  Your safety is important to us .  If you have drug allergies check your prescription carefully.    You can use MyChart to ask questions about today's visit, request a non-urgent call back, or ask for a work or school excuse for 24 hours related to this e-Visit. If it has been greater than 24 hours you will need to follow up with your provider, or enter a new e-Visit to address those concerns.  You will get  an e-mail in the next two days asking about your experience.  I hope that your e-visit has been valuable and will speed your recovery. Thank you for using e-visits.   I have spent 5 minutes in review of e-visit questionnaire, review and updating patient chart, medical decision making and response to patient.   Elsie Velma Lunger, PA-C

## 2024-03-06 DIAGNOSIS — Z0000 Encounter for general adult medical examination without abnormal findings: Secondary | ICD-10-CM | POA: Diagnosis not present

## 2024-03-12 LAB — GENECONNECT MOLECULAR SCREEN: Genetic Analysis Overall Interpretation: NEGATIVE

## 2024-04-23 ENCOUNTER — Telehealth: Admitting: Emergency Medicine

## 2024-04-23 DIAGNOSIS — M546 Pain in thoracic spine: Secondary | ICD-10-CM

## 2024-04-23 MED ORDER — NAPROXEN 500 MG PO TABS: 500.0000 mg | ORAL_TABLET | Freq: Two times a day (BID) | ORAL | 0 refills | Status: AC

## 2024-04-23 MED ORDER — TIZANIDINE HCL 2 MG PO TABS: 2.0000 mg | ORAL_TABLET | Freq: Four times a day (QID) | ORAL | 0 refills | Status: AC | PRN

## 2024-04-23 NOTE — Progress Notes (Signed)
 We are sorry that you are not feeling well.  Here is how we plan to help!  Based on what you have shared with me it, appears you may be experiencing acute back pain.   Acute back pain is defined as musculoskeletal pain that can resolve in 1-3 weeks with conservative treatment.  I have prescribed a non-steroid anti-inflammatory (NSAID) Naprosyn  500 mg -- take one by mouth twice a day, as well as a muscle relaxant, Tizanidine  2 mg every eight hours as needed. Some patients experience stomach irritation or in increased heartburn with anti-inflammatory drugs.  Please keep in mind that muscle relaxer's can cause fatigue and should not be taken while at work or driving.  Back pain is very common.  The pain often gets better over time.  The cause of back pain is usually not dangerous.  Most people can learn to manage their back pain on their own.  Home Care Stay active.  Start with short walks on flat ground if you can.  Try to walk farther each day. Do not sit, drive or stand in one place for more than 30 minutes.  Do not stay in bed. Do not fully avoid exercise or work.  Activity can help your back heal faster. Be careful when you bend or lift an object.  Bend at your knees, keep the object close to you, and do not twist. Sleep on a firm mattress.  Lie on your side, and bend your knees.  If you lie on your back, put a pillow under your knees. Only take medicines as told by your doctor. Put ice on the injured area. Put ice in a plastic bag Place a towel between your skin and the bag Leave the ice on for 15-20 minutes, 3-4 times a day for the first 2-3 days.  After that, you can switch between ice and heat packs. Ask your doctor about back exercises or massage.   Get Help Right Way If: Your pain does not go away with rest and treatments given today. Your pain does not go away within 1 week. You have new problems. You do not feel well. The pain spreads into your legs. You cannot control when you  poop (bowel movement) or pee (urinate). You feel sick to your stomach (nauseous) or throw up (vomit). You have belly (abdominal) pain. You feel like you may pass out (faint). If you develop a fever.  Make Sure you: Understand these instructions. Continue to monitor your condition for any changes. Will get help right away if you are not doing well or get worse.  Your e-visit answers were reviewed by a board certified advanced clinical practitioner to complete your personal care plan.  Depending on the condition, your plan could have included both over the counter or prescription medications.  If there is a problem, please reply once you have received a response from your provider.  Your safety is important to us .  If you have drug allergies, check your prescription carefully.    You can use MyChart to ask questions about todays visit, request a non-urgent call back, or ask for a work or school excuse for 24 hours related to this e-Visit. If it has been greater than 24 hours you will need to follow up with your provider or enter a new e-Visit to address those concerns.  You will get an e-mail in the next two days asking about your experience.  I hope that your e-visit has been valuable and will speed your  recovery. Thank you for using e-visits.   I have spent 5 minutes in review of e-visit questionnaire, review and updating patient chart, medical decision making and response to patient.   Jon Belt, PhD, FNP-BC

## 2024-04-24 DIAGNOSIS — M546 Pain in thoracic spine: Secondary | ICD-10-CM

## 2024-04-25 ENCOUNTER — Other Ambulatory Visit: Payer: Self-pay | Admitting: Emergency Medicine

## 2024-04-25 DIAGNOSIS — M546 Pain in thoracic spine: Secondary | ICD-10-CM

## 2024-04-26 ENCOUNTER — Other Ambulatory Visit: Payer: Self-pay | Admitting: Emergency Medicine

## 2024-04-26 DIAGNOSIS — M546 Pain in thoracic spine: Secondary | ICD-10-CM

## 2024-04-30 ENCOUNTER — Other Ambulatory Visit: Payer: Self-pay | Admitting: Emergency Medicine

## 2024-04-30 DIAGNOSIS — M546 Pain in thoracic spine: Secondary | ICD-10-CM
